# Patient Record
Sex: Female | Born: 1950 | Race: Black or African American | Hispanic: No | Marital: Married | Smoking: Never smoker
Health system: Southern US, Community
[De-identification: ages and names within clinical notes are randomized; demographics above are authoritative.]

## PROBLEM LIST (undated history)

## (undated) DIAGNOSIS — M549 Dorsalgia, unspecified: Secondary | ICD-10-CM

## (undated) DIAGNOSIS — D3610 Benign neoplasm of peripheral nerves and autonomic nervous system, unspecified: Secondary | ICD-10-CM

## (undated) DIAGNOSIS — R51 Headache: Secondary | ICD-10-CM

## (undated) DIAGNOSIS — Z6830 Body mass index (BMI) 30.0-30.9, adult: Secondary | ICD-10-CM

## (undated) DIAGNOSIS — D1722 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: Secondary | ICD-10-CM

## (undated) DIAGNOSIS — F410 Panic disorder [episodic paroxysmal anxiety] without agoraphobia: Secondary | ICD-10-CM

## (undated) DIAGNOSIS — K259 Gastric ulcer, unspecified as acute or chronic, without hemorrhage or perforation: Secondary | ICD-10-CM

## (undated) DIAGNOSIS — R519 Headache, unspecified: Secondary | ICD-10-CM

## (undated) DIAGNOSIS — K449 Diaphragmatic hernia without obstruction or gangrene: Secondary | ICD-10-CM

## (undated) DIAGNOSIS — R5383 Other fatigue: Secondary | ICD-10-CM

## (undated) DIAGNOSIS — J349 Unspecified disorder of nose and nasal sinuses: Secondary | ICD-10-CM

## (undated) DIAGNOSIS — B2790 Infectious mononucleosis, unspecified without complication: Secondary | ICD-10-CM

## (undated) DIAGNOSIS — E039 Hypothyroidism, unspecified: Secondary | ICD-10-CM

## (undated) DIAGNOSIS — R140 Abdominal distension (gaseous): Secondary | ICD-10-CM

## (undated) DIAGNOSIS — I499 Cardiac arrhythmia, unspecified: Secondary | ICD-10-CM

## (undated) DIAGNOSIS — R109 Unspecified abdominal pain: Secondary | ICD-10-CM

## (undated) DIAGNOSIS — M810 Age-related osteoporosis without current pathological fracture: Secondary | ICD-10-CM

## (undated) DIAGNOSIS — M25511 Pain in right shoulder: Secondary | ICD-10-CM

## (undated) DIAGNOSIS — K219 Gastro-esophageal reflux disease without esophagitis: Secondary | ICD-10-CM

## (undated) DIAGNOSIS — I119 Hypertensive heart disease without heart failure: Secondary | ICD-10-CM

## (undated) DIAGNOSIS — R0602 Shortness of breath: Secondary | ICD-10-CM

## (undated) DIAGNOSIS — M797 Fibromyalgia: Secondary | ICD-10-CM

## (undated) DIAGNOSIS — K589 Irritable bowel syndrome without diarrhea: Secondary | ICD-10-CM

## (undated) DIAGNOSIS — E559 Vitamin D deficiency, unspecified: Secondary | ICD-10-CM

## (undated) DIAGNOSIS — R600 Localized edema: Secondary | ICD-10-CM

## (undated) DIAGNOSIS — H9209 Otalgia, unspecified ear: Secondary | ICD-10-CM

## (undated) DIAGNOSIS — G43909 Migraine, unspecified, not intractable, without status migrainosus: Secondary | ICD-10-CM

## (undated) DIAGNOSIS — C642 Malignant neoplasm of left kidney, except renal pelvis: Secondary | ICD-10-CM

## (undated) DIAGNOSIS — F32A Depression, unspecified: Secondary | ICD-10-CM

## (undated) DIAGNOSIS — M419 Scoliosis, unspecified: Secondary | ICD-10-CM

## (undated) DIAGNOSIS — D649 Anemia, unspecified: Secondary | ICD-10-CM

## (undated) DIAGNOSIS — F419 Anxiety disorder, unspecified: Secondary | ICD-10-CM

## (undated) DIAGNOSIS — M542 Cervicalgia: Secondary | ICD-10-CM

## (undated) DIAGNOSIS — I10 Essential (primary) hypertension: Secondary | ICD-10-CM

## (undated) DIAGNOSIS — J309 Allergic rhinitis, unspecified: Secondary | ICD-10-CM

## (undated) DIAGNOSIS — Z91018 Allergy to other foods: Secondary | ICD-10-CM

## (undated) DIAGNOSIS — R079 Chest pain, unspecified: Secondary | ICD-10-CM

## (undated) DIAGNOSIS — B259 Cytomegaloviral disease, unspecified: Secondary | ICD-10-CM

## (undated) DIAGNOSIS — R1030 Lower abdominal pain, unspecified: Secondary | ICD-10-CM

## (undated) DIAGNOSIS — E785 Hyperlipidemia, unspecified: Secondary | ICD-10-CM

## (undated) DIAGNOSIS — G4730 Sleep apnea, unspecified: Secondary | ICD-10-CM

## (undated) DIAGNOSIS — R5381 Other malaise: Secondary | ICD-10-CM

## (undated) DIAGNOSIS — N898 Other specified noninflammatory disorders of vagina: Secondary | ICD-10-CM

## (undated) DIAGNOSIS — F515 Nightmare disorder: Secondary | ICD-10-CM

## (undated) DIAGNOSIS — R002 Palpitations: Secondary | ICD-10-CM

## (undated) DIAGNOSIS — F514 Sleep terrors [night terrors]: Secondary | ICD-10-CM

## (undated) DIAGNOSIS — M25512 Pain in left shoulder: Secondary | ICD-10-CM

## (undated) DIAGNOSIS — E349 Endocrine disorder, unspecified: Secondary | ICD-10-CM

## (undated) DIAGNOSIS — R112 Nausea with vomiting, unspecified: Secondary | ICD-10-CM

## (undated) DIAGNOSIS — K5900 Constipation, unspecified: Secondary | ICD-10-CM

## (undated) DIAGNOSIS — G4700 Insomnia, unspecified: Secondary | ICD-10-CM

## (undated) DIAGNOSIS — R197 Diarrhea, unspecified: Secondary | ICD-10-CM

## (undated) DIAGNOSIS — M2550 Pain in unspecified joint: Secondary | ICD-10-CM

## (undated) DIAGNOSIS — J189 Pneumonia, unspecified organism: Secondary | ICD-10-CM

## (undated) HISTORY — DX: Other malaise: R53.83

## (undated) HISTORY — DX: Malignant neoplasm of left kidney, except renal pelvis: C64.2

## (undated) HISTORY — DX: Localized edema: R60.0

## (undated) HISTORY — DX: Shortness of breath: R06.02

## (undated) HISTORY — DX: Nausea with vomiting, unspecified: R11.2

## (undated) HISTORY — DX: Vitamin D deficiency, unspecified: E55.9

## (undated) HISTORY — DX: Body mass index (BMI) 30.0-30.9, adult: Z68.30

## (undated) HISTORY — DX: 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: D17.22

## (undated) HISTORY — DX: Other specified noninflammatory disorders of vagina: N89.8

## (undated) HISTORY — DX: Other malaise: R53.81

## (undated) HISTORY — DX: Depression, unspecified: F32.A

## (undated) HISTORY — DX: Otalgia, unspecified ear: H92.09

## (undated) HISTORY — DX: Sleep terrors (night terrors): F51.4

## (undated) HISTORY — PX: ANKLE SURGERY: SHX546

## (undated) HISTORY — DX: Chest pain, unspecified: R07.9

## (undated) HISTORY — DX: Diaphragmatic hernia without obstruction or gangrene: K44.9

## (undated) HISTORY — DX: Scoliosis, unspecified: M41.9

## (undated) HISTORY — DX: Allergic rhinitis, unspecified: J30.9

## (undated) HISTORY — DX: Cytomegaloviral disease, unspecified: B25.9

## (undated) HISTORY — DX: Hypothyroidism, unspecified: E03.9

## (undated) HISTORY — DX: Gastric ulcer, unspecified as acute or chronic, without hemorrhage or perforation: K25.9

## (undated) HISTORY — DX: Anemia, unspecified: D64.9

## (undated) HISTORY — DX: Pain in left shoulder: M25.512

## (undated) HISTORY — DX: Palpitations: R00.2

## (undated) HISTORY — DX: Hyperlipidemia, unspecified: E78.5

## (undated) HISTORY — DX: Pain in unspecified joint: M25.50

## (undated) HISTORY — PX: UPPER GASTROINTESTINAL ENDOSCOPY: SHX188

## (undated) HISTORY — DX: Constipation, unspecified: K59.00

## (undated) HISTORY — DX: Abdominal distension (gaseous): R14.0

## (undated) HISTORY — DX: Sleep apnea, unspecified: G47.30

## (undated) HISTORY — DX: Benign neoplasm of peripheral nerves and autonomic nervous system, unspecified: D36.10

## (undated) HISTORY — DX: Migraine, unspecified, not intractable, without status migrainosus: G43.909

## (undated) HISTORY — DX: Cervicalgia: M54.2

## (undated) HISTORY — PX: OTHER SURGICAL HISTORY: SHX169

## (undated) HISTORY — DX: Nightmare disorder: F51.5

## (undated) HISTORY — DX: Lower abdominal pain, unspecified: R10.30

## (undated) HISTORY — PX: LEFT HEART CATH AND CORONARY ANGIOGRAPHY: CATH118249

## (undated) HISTORY — DX: Allergy to other foods: Z91.018

## (undated) HISTORY — DX: Unspecified abdominal pain: R10.9

## (undated) HISTORY — DX: Diarrhea, unspecified: R19.7

## (undated) HISTORY — DX: Gastro-esophageal reflux disease without esophagitis: K21.9

## (undated) HISTORY — PX: ABDOMINAL HYSTERECTOMY: SHX81

## (undated) HISTORY — DX: Pain in right shoulder: M25.511

## (undated) HISTORY — DX: Infectious mononucleosis, unspecified without complication: B27.90

## (undated) HISTORY — DX: Endocrine disorder, unspecified: E34.9

## (undated) HISTORY — PX: DILATION AND CURETTAGE OF UTERUS: SHX78

## (undated) HISTORY — DX: Hypertensive heart disease without heart failure: I11.9

## (undated) HISTORY — DX: Dorsalgia, unspecified: M54.9

## (undated) HISTORY — DX: Panic disorder (episodic paroxysmal anxiety): F41.0

## (undated) HISTORY — DX: Unspecified disorder of nose and nasal sinuses: J34.9

## (undated) HISTORY — DX: Age-related osteoporosis without current pathological fracture: M81.0

## (undated) HISTORY — DX: Anxiety disorder, unspecified: F41.9

## (undated) HISTORY — DX: Irritable bowel syndrome, unspecified: K58.9

## (undated) HISTORY — DX: Fibromyalgia: M79.7

## (undated) HISTORY — PX: TUBAL LIGATION: SHX77

## (undated) HISTORY — DX: Insomnia, unspecified: G47.00

---

## 2013-07-05 DIAGNOSIS — B001 Herpesviral vesicular dermatitis: Secondary | ICD-10-CM | POA: Insufficient documentation

## 2014-03-09 DIAGNOSIS — F5104 Psychophysiologic insomnia: Secondary | ICD-10-CM | POA: Insufficient documentation

## 2014-03-15 DIAGNOSIS — F411 Generalized anxiety disorder: Secondary | ICD-10-CM | POA: Insufficient documentation

## 2014-03-16 DIAGNOSIS — I447 Left bundle-branch block, unspecified: Secondary | ICD-10-CM | POA: Insufficient documentation

## 2015-04-12 DIAGNOSIS — F329 Major depressive disorder, single episode, unspecified: Secondary | ICD-10-CM | POA: Insufficient documentation

## 2015-07-10 DIAGNOSIS — H6120 Impacted cerumen, unspecified ear: Secondary | ICD-10-CM | POA: Insufficient documentation

## 2015-07-10 DIAGNOSIS — H903 Sensorineural hearing loss, bilateral: Secondary | ICD-10-CM | POA: Insufficient documentation

## 2016-02-28 DIAGNOSIS — S93491A Sprain of other ligament of right ankle, initial encounter: Secondary | ICD-10-CM | POA: Insufficient documentation

## 2016-07-29 DIAGNOSIS — M810 Age-related osteoporosis without current pathological fracture: Secondary | ICD-10-CM | POA: Insufficient documentation

## 2017-03-03 DIAGNOSIS — I10 Essential (primary) hypertension: Secondary | ICD-10-CM | POA: Diagnosis not present

## 2017-03-03 DIAGNOSIS — R69 Illness, unspecified: Secondary | ICD-10-CM | POA: Diagnosis not present

## 2017-03-04 DIAGNOSIS — R200 Anesthesia of skin: Secondary | ICD-10-CM | POA: Diagnosis not present

## 2017-03-04 DIAGNOSIS — R51 Headache: Secondary | ICD-10-CM | POA: Diagnosis not present

## 2017-03-04 DIAGNOSIS — G43909 Migraine, unspecified, not intractable, without status migrainosus: Secondary | ICD-10-CM | POA: Diagnosis not present

## 2017-03-04 DIAGNOSIS — R079 Chest pain, unspecified: Secondary | ICD-10-CM | POA: Diagnosis not present

## 2017-03-10 DIAGNOSIS — M25511 Pain in right shoulder: Secondary | ICD-10-CM | POA: Diagnosis not present

## 2017-04-13 DIAGNOSIS — K5909 Other constipation: Secondary | ICD-10-CM | POA: Diagnosis not present

## 2017-04-15 DIAGNOSIS — M545 Low back pain: Secondary | ICD-10-CM | POA: Diagnosis not present

## 2017-04-20 DIAGNOSIS — M545 Low back pain: Secondary | ICD-10-CM | POA: Diagnosis not present

## 2017-04-29 DIAGNOSIS — K5904 Chronic idiopathic constipation: Secondary | ICD-10-CM | POA: Diagnosis not present

## 2017-04-29 DIAGNOSIS — M545 Low back pain: Secondary | ICD-10-CM | POA: Diagnosis not present

## 2017-04-30 DIAGNOSIS — R109 Unspecified abdominal pain: Secondary | ICD-10-CM | POA: Diagnosis not present

## 2017-04-30 DIAGNOSIS — R1084 Generalized abdominal pain: Secondary | ICD-10-CM | POA: Diagnosis not present

## 2017-05-04 DIAGNOSIS — D49512 Neoplasm of unspecified behavior of left kidney: Secondary | ICD-10-CM | POA: Diagnosis not present

## 2017-05-05 ENCOUNTER — Other Ambulatory Visit: Payer: Self-pay | Admitting: Urology

## 2017-05-05 DIAGNOSIS — R1900 Intra-abdominal and pelvic swelling, mass and lump, unspecified site: Secondary | ICD-10-CM

## 2017-05-10 ENCOUNTER — Other Ambulatory Visit: Payer: Self-pay | Admitting: Radiology

## 2017-05-10 ENCOUNTER — Other Ambulatory Visit: Payer: Self-pay | Admitting: Student

## 2017-05-11 ENCOUNTER — Encounter (HOSPITAL_COMMUNITY): Payer: Self-pay | Admitting: *Deleted

## 2017-05-11 ENCOUNTER — Ambulatory Visit (HOSPITAL_COMMUNITY)
Admission: RE | Admit: 2017-05-11 | Discharge: 2017-05-11 | Disposition: A | Discharge status: Home / Self Care | Payer: Medicare HMO | Source: Ambulatory Visit | Attending: Urology | Admitting: Urology

## 2017-05-11 DIAGNOSIS — N2889 Other specified disorders of kidney and ureter: Secondary | ICD-10-CM | POA: Insufficient documentation

## 2017-05-11 DIAGNOSIS — Z880 Allergy status to penicillin: Secondary | ICD-10-CM | POA: Insufficient documentation

## 2017-05-11 DIAGNOSIS — N289 Disorder of kidney and ureter, unspecified: Secondary | ICD-10-CM | POA: Diagnosis not present

## 2017-05-11 DIAGNOSIS — Z882 Allergy status to sulfonamides status: Secondary | ICD-10-CM | POA: Diagnosis not present

## 2017-05-11 DIAGNOSIS — Z79891 Long term (current) use of opiate analgesic: Secondary | ICD-10-CM | POA: Diagnosis not present

## 2017-05-11 DIAGNOSIS — Z79899 Other long term (current) drug therapy: Secondary | ICD-10-CM | POA: Insufficient documentation

## 2017-05-11 DIAGNOSIS — R1909 Other intra-abdominal and pelvic swelling, mass and lump: Secondary | ICD-10-CM | POA: Diagnosis not present

## 2017-05-11 DIAGNOSIS — M549 Dorsalgia, unspecified: Secondary | ICD-10-CM | POA: Insufficient documentation

## 2017-05-11 DIAGNOSIS — R1900 Intra-abdominal and pelvic swelling, mass and lump, unspecified site: Secondary | ICD-10-CM | POA: Insufficient documentation

## 2017-05-11 LAB — CBC
HEMATOCRIT: 44.2 % (ref 36.0–46.0)
Hemoglobin: 14.8 g/dL (ref 12.0–15.0)
MCH: 30.1 pg (ref 26.0–34.0)
MCHC: 33.5 g/dL (ref 30.0–36.0)
MCV: 89.8 fL (ref 78.0–100.0)
Platelets: 236 10*3/uL (ref 150–400)
RBC: 4.92 MIL/uL (ref 3.87–5.11)
RDW: 14.3 % (ref 11.5–15.5)
WBC: 4.8 10*3/uL (ref 4.0–10.5)

## 2017-05-11 LAB — PROTIME-INR
INR: 1.02
Prothrombin Time: 13.3 seconds (ref 11.4–15.2)

## 2017-05-11 LAB — APTT: APTT: 29 s (ref 24–36)

## 2017-05-11 MED ORDER — MIDAZOLAM HCL 2 MG/2ML IJ SOLN
INTRAMUSCULAR | Status: AC
  Filled 2017-05-11: qty 4

## 2017-05-11 MED ORDER — MIDAZOLAM HCL 2 MG/2ML IJ SOLN
INTRAMUSCULAR | Status: DC | PRN
  Administered 2017-05-11 (×3): 1 mg via INTRAVENOUS

## 2017-05-11 MED ORDER — HYDROCODONE-ACETAMINOPHEN 5-325 MG PO TABS: 1.0000 | ORAL_TABLET | ORAL | Status: DC | PRN

## 2017-05-11 MED ORDER — FENTANYL CITRATE (PF) 100 MCG/2ML IJ SOLN
INTRAMUSCULAR | Status: AC
  Filled 2017-05-11: qty 4

## 2017-05-11 MED ORDER — SODIUM CHLORIDE 0.9 % IV SOLN
INTRAVENOUS | Status: DC | PRN
  Administered 2017-05-11: 10 mL/h via INTRAVENOUS

## 2017-05-11 MED ORDER — FENTANYL CITRATE (PF) 100 MCG/2ML IJ SOLN
INTRAMUSCULAR | Status: DC | PRN
  Administered 2017-05-11 (×3): 50 ug via INTRAVENOUS

## 2017-05-11 MED ORDER — LIDOCAINE HCL 1 % IJ SOLN
INTRAMUSCULAR | Status: AC
  Filled 2017-05-11: qty 20

## 2017-05-11 MED ORDER — SODIUM CHLORIDE 0.9 % IV SOLN: INTRAVENOUS | Status: DC

## 2017-05-11 NOTE — Sedation Documentation (Signed)
Patient is resting comfortably. 

## 2017-05-11 NOTE — Discharge Instructions (Signed)
Needle Biopsy, Care After °These instructions give you information about caring for yourself after your procedure. Your doctor may also give you more specific instructions. Call your doctor if you have any problems or questions after your procedure. °Follow these instructions at home: °· Rest as told by your doctor. °· Take medicines only as told by your doctor. °· There are many different ways to close and cover the biopsy site, including stitches (sutures), skin glue, and adhesive strips. Follow instructions from your doctor about: °? How to take care of your biopsy site. °? When and how you should change your bandage (dressing). °? When you should remove your dressing. °? Removing whatever was used to close your biopsy site. °· Check your biopsy site every day for signs of infection. Watch for: °? Redness, swelling, or pain. °? Fluid, blood, or pus. °Contact a doctor if: °· You have a fever. °· You have redness, swelling, or pain at the biopsy site, and it lasts longer than a few days. °· You have fluid, blood, or pus coming from the biopsy site. °· You feel sick to your stomach (nauseous). °· You throw up (vomit). °Get help right away if: °· You are short of breath. °· You have trouble breathing. °· Your chest hurts. °· You feel dizzy or you pass out (faint). °· You have bleeding that does not stop with pressure or a bandage. °· You cough up blood. °· Your belly (abdomen) hurts. °This information is not intended to replace advice given to you by your health care provider. Make sure you discuss any questions you have with your health care provider. °Document Released: 05/28/2008 Document Revised: 11/21/2015 Document Reviewed: 06/11/2014 °Elsevier Interactive Patient Education © 2018 [employer] ° °

## 2017-05-11 NOTE — Procedures (Signed)
CT guided core biopsies of right retroperitoneal lesion.  7 cores obtained.  1 specimen placed in saline.  Minimal blood loss and no immediate complication.

## 2017-05-11 NOTE — H&P (Signed)
Chief Complaint: Patient was seen in consultation today for right retroperitoneal mass biopsy at the request of Herrick,Benjamin W  Referring Physician(s): Herrick,Benjamin W  Supervising Physician: Markus Daft  Patient Status: [facility] - Out-pt  History of Present Illness: Maria Middleton is a 66 y.o. 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 No pertinent past medical history.  History reviewed. No pertinent surgical history.  Allergies: Penicillins and Sulfa antibiotics  Medications: Prior to Admission medications   Medication Sig Start Date End Date Taking? Authorizing Provider  Ascorbic Acid (VITAMIN C PO) Take 1,500 mg daily as needed by mouth (stress).   Yes [provider]  Garlic 299 MG CAPS Take 1 capsule daily by mouth.   Yes [provider]  LORazepam (ATIVAN) 0.5 MG tablet Take 0.5 mg at bedtime as needed by mouth for sleep.   Yes [provider]  OVER THE COUNTER MEDICATION Take 1 drop as needed by mouth (Anxiety). Flower Herbal Essence - Bleeding Heart or Chamomile   Yes [provider]  PAU D ARCO PO Take 3 capsules at bedtime by mouth. 540 mg per cap   Yes [provider]  traMADol (ULTRAM) 50 MG tablet Take 50 mg at bedtime as needed by mouth for moderate pain.   Yes [provider]  valsartan-hydrochlorothiazide (DIOVAN-HCT) 160-12.5 MG tablet Take 1 tablet daily by mouth.   Yes [provider]  Vitamin D, Ergocalciferol, (DRISDOL) 50000 units CAPS capsule Take 50,000 Units every 7  (seven) days by mouth. Sundays   Yes [provider]  Licorice, Glycyrrhiza glabra, (LICORICE ROOT PO) Take 1 capsule daily as needed by mouth (acid reflux).    [provider]     History reviewed. No pertinent family history.  Social History   Socioeconomic History  . Marital status: Single    Spouse name: None  . Number of children: None  . Years of education: None  . Highest education level: None  Social Needs  . Financial resource strain: None  . Food insecurity - worry: None  . Food insecurity - inability: None  . Transportation needs - medical: None  . Transportation needs - non-medical: None  Occupational History  . None  Tobacco Use  . Smoking status: None  Substance and Sexual Activity  . Alcohol use: None  . Drug use: None  . Sexual activity: None  Other Topics Concern  . None  Social History Narrative  . None    Review of Systems: A 12 point ROS discussed and pertinent positives are indicated in the HPI above.  All other systems are negative.  Review of Systems  Constitutional: Negative for activity change, fatigue and fever.  Respiratory: Negative for shortness of breath.   Cardiovascular: Negative for chest pain.  Gastrointestinal: Negative for abdominal pain.  Genitourinary: Positive for flank pain.  Musculoskeletal: Positive for back pain.  Psychiatric/Behavioral: Negative for behavioral problems and confusion.    Vital Signs: BP (!) 146/96 (BP Location: Right Arm)   Pulse 90   Temp (!) 97.4 F (36.3 C) (Oral)   Resp  18   Ht 5\' 3"  (1.6 m)   Wt 165 lb (74.8 kg)   SpO2 100%   BMI 29.23 kg/m   Physical Exam  Constitutional: She is oriented to person, place, and time.  Cardiovascular: Normal rate, regular rhythm and normal heart sounds.  Pulmonary/Chest: Effort normal.  Abdominal: Soft. Bowel sounds are normal.  Musculoskeletal: Normal range of motion.  Neurological: She is alert and oriented to person, place, and time.    Skin: Skin is warm and dry.  Psychiatric: She has a normal mood and affect. Her behavior is normal. Judgment and thought content normal.  Nursing note and vitals reviewed.   Imaging: No results found.  Labs:  CBC: Recent Labs    05/11/17 0935  WBC 4.8  HGB 14.8  HCT 44.2  PLT 236    COAGS: Recent Labs    05/11/17 0935  INR 1.02  APTT 29    BMP: No results for input(s): NA, K, CL, CO2, GLUCOSE, BUN, CALCIUM, CREATININE, GFRNONAA, GFRAA in the last 8760 hours.  Invalid input(s): CMP  LIVER FUNCTION TESTS: No results for input(s): BILITOT, AST, ALT, ALKPHOS, PROT, ALBUMIN in the last 8760 hours.  TUMOR MARKERS: No results for input(s): AFPTM, CEA, CA199, CHROMGRNA in the last 8760 hours.  Assessment and Plan:  Left renal mass Right retroperitoneal mass Scheduled for R RP mass biopsy today (plan for renal mass to be determined)  Risks and benefits discussed with the patient including, but not limited to bleeding, infection, damage to adjacent structures or low yield requiring additional tests. All of the patient's questions were answered, patient is agreeable to proceed. Consent signed and in chart.  Thank you for this interesting consult.  I greatly enjoyed meeting [name] and look forward to participating in their care.  A copy of this report was sent to the requesting provider on this date.  Electronically Signed: Lavonia Drafts, PA-C 05/11/2017, 10:46 AM   I spent a total of  30 Minutes   in face to face in clinical consultation, greater than 50% of which was counseling/coordinating care for right RP mass biopsy

## 2017-06-10 DIAGNOSIS — R1900 Intra-abdominal and pelvic swelling, mass and lump, unspecified site: Secondary | ICD-10-CM | POA: Diagnosis not present

## 2017-06-17 DIAGNOSIS — R1900 Intra-abdominal and pelvic swelling, mass and lump, unspecified site: Secondary | ICD-10-CM | POA: Diagnosis not present

## 2017-06-17 DIAGNOSIS — N289 Disorder of kidney and ureter, unspecified: Secondary | ICD-10-CM | POA: Diagnosis not present

## 2017-06-17 DIAGNOSIS — M47816 Spondylosis without myelopathy or radiculopathy, lumbar region: Secondary | ICD-10-CM | POA: Diagnosis not present

## 2017-07-06 DIAGNOSIS — D49512 Neoplasm of unspecified behavior of left kidney: Secondary | ICD-10-CM | POA: Diagnosis not present

## 2017-07-08 ENCOUNTER — Other Ambulatory Visit: Payer: Self-pay | Admitting: Urology

## 2017-07-09 ENCOUNTER — Other Ambulatory Visit: Payer: Self-pay | Admitting: Urology

## 2017-08-11 NOTE — Patient Instructions (Addendum)
Maria Middleton  08/11/2017   Your procedure is scheduled on: Wednesday 08/25/2017  Report to [facility] Main  Entrance  Follow signs to Short Stay on first floor at 530 AM   Call this number if you have problems the morning of surgery [phone]               Follow bowel prep instructions from Dr. Carlton Adam office the day before surgery along with a clear liquid diet up until midnight!                 CLEAR LIQUID DIET   Foods Allowed                                                                     Foods Excluded  Coffee and tea, regular and decaf                             liquids that you cannot  Plain Jell-O in any flavor                                             see through such as: Fruit ices (not with fruit pulp)                                     milk, soups, orange juice  Iced Popsicles                                    All solid food Carbonated beverages, regular and diet                                    Cranberry, grape and apple juices Sports drinks like Gatorade Lightly seasoned clear broth or consume(fat free) Sugar, honey syrup  Sample Menu Breakfast                                Lunch                                     Supper Cranberry juice                    Beef broth                            Chicken broth Jell-O                                     Grape juice  Apple juice Coffee or tea                        Jell-O                                      Popsicle                                                Coffee or tea                        Coffee or tea  _____________________________________________________________________      Remember: Do not eat food or drink liquids :After Midnight.     Take these medicines the morning of surgery with A SIP OF WATER: none                                You may not have any metal on your body including hair pins and              piercings   Do not wear jewelry, make-up, lotions, powders or perfumes, deodorant             Do not wear nail polish.  Do not shave  48 hours prior to surgery.              Men may shave face and neck.   Do not bring valuables to the hospital. [place].  Contacts, dentures or bridgework may not be worn into surgery.  Leave suitcase in the car. After surgery it may be brought to your room.                 Please read over the following fact sheets you were given: _____________________________________________________________________             [facility] - Preparing for Surgery Before surgery, you can play an important role.  Because skin is not sterile, your skin needs to be as free of germs as possible.  You can reduce the number of germs on your skin by washing with CHG (chlorahexidine gluconate) soap before surgery.  CHG is an antiseptic cleaner which kills germs and bonds with the skin to continue killing germs even after washing. Please DO NOT use if you have an allergy to CHG or antibacterial soaps.  If your skin becomes reddened/irritated stop using the CHG and inform your nurse when you arrive at Short Stay. Do not shave (including legs and underarms) for at least 48 hours prior to the first CHG shower.  You may shave your face/neck. Please follow these instructions carefully:  1.  Shower with CHG Soap the night before surgery and the  morning of Surgery.  2.  If you choose to wash your hair, wash your hair first as usual with your  normal  shampoo.  3.  After you shampoo, rinse your hair and body thoroughly to remove the  shampoo.  4.  Use CHG as you would any other liquid soap.  You can apply chg directly  to the skin and wash                       Gently with a scrungie or clean washcloth.  5.  Apply the CHG Soap to your body ONLY FROM THE NECK DOWN.   Do not use on face/ open                           Wound or  open sores. Avoid contact with eyes, ears mouth and genitals (private parts).                       Wash face,  Genitals (private parts) with your normal soap.             6.  Wash thoroughly, paying special attention to the area where your surgery  will be performed.  7.  Thoroughly rinse your body with warm water from the neck down.  8.  DO NOT shower/wash with your normal soap after using and rinsing off  the CHG Soap.                9.  Pat yourself dry with a clean towel.            10.  Wear clean pajamas.            11.  Place clean sheets on your bed the night of your first shower and do not  sleep with pets. Day of Surgery : Do not apply any lotions/deodorants the morning of surgery.  Please wear clean clothes to the hospital/surgery center.  FAILURE TO FOLLOW THESE INSTRUCTIONS MAY RESULT IN THE CANCELLATION OF YOUR SURGERY PATIENT SIGNATURE_________________________________  NURSE SIGNATURE__________________________________  ________________________________________________________________________  WHAT IS A BLOOD TRANSFUSION? Blood Transfusion Information  A transfusion is the replacement of blood or some of its parts. Blood is made up of multiple cells which provide different functions.  Red blood cells carry oxygen and are used for blood loss replacement.  White blood cells fight against infection.  Platelets control bleeding.  Plasma helps clot blood.  Other blood products are available for specialized needs, such as hemophilia or other clotting disorders. BEFORE THE TRANSFUSION  Who gives blood for transfusions?   Healthy volunteers who are fully evaluated to make sure their blood is safe. This is blood bank blood. Transfusion therapy is the safest it has ever been in the practice of medicine. Before blood is taken from a donor, a complete history is taken to make sure that person has no history of diseases nor engages in risky social behavior (examples are  intravenous drug use or sexual activity with multiple partners). The donor's travel history is screened to minimize risk of transmitting infections, such as malaria. The donated blood is tested for signs of infectious diseases, such as HIV and hepatitis. The blood is then tested to be sure it is compatible with you in order to minimize the chance of a transfusion reaction. If you or a relative donates blood, this is often done in anticipation of surgery and is not appropriate for emergency situations. It takes many days to process the donated blood. RISKS AND COMPLICATIONS Although transfusion therapy is very safe and saves many lives, the main dangers of transfusion include:   Getting an infectious disease.  Developing a transfusion reaction.  This is an allergic reaction to something in the blood you were given. Every precaution is taken to prevent this. The decision to have a blood transfusion has been considered carefully by your caregiver before blood is given. Blood is not given unless the benefits outweigh the risks. AFTER THE TRANSFUSION  Right after receiving a blood transfusion, you will usually feel much better and more energetic. This is especially true if your red blood cells have gotten low (anemic). The transfusion raises the level of the red blood cells which carry oxygen, and this usually causes an energy increase.  The nurse administering the transfusion will monitor you carefully for complications. HOME CARE INSTRUCTIONS  No special instructions are needed after a transfusion. You may find your energy is better. Speak with your caregiver about any limitations on activity for underlying diseases you may have. SEEK MEDICAL CARE IF:   Your condition is not improving after your transfusion.  You develop redness or irritation at the intravenous (IV) site. SEEK IMMEDIATE MEDICAL CARE IF:  Any of the following symptoms occur over the next 12 hours:  Shaking chills.  You have a  temperature by mouth above 102 F (38.9 C), not controlled by medicine.  Chest, back, or muscle pain.  People around you feel you are not acting correctly or are confused.  Shortness of breath or difficulty breathing.  Dizziness and fainting.  You get a rash or develop hives.  You have a decrease in urine output.  Your urine turns a dark color or changes to pink, red, or brown. Any of the following symptoms occur over the next 10 days:  You have a temperature by mouth above 102 F (38.9 C), not controlled by medicine.  Shortness of breath.  Weakness after normal activity.  The white part of the eye turns yellow (jaundice).  You have a decrease in the amount of urine or are urinating less often.  Your urine turns a dark color or changes to pink, red, or brown. Document Released: 06/12/2000 Document Revised: 09/07/2011 Document Reviewed: 01/30/2008 [facility] Patient Information 2014 [place], [place].  _______________________________________________________________________

## 2017-08-16 ENCOUNTER — Encounter (HOSPITAL_COMMUNITY)
Admission: RE | Admit: 2017-08-16 | Discharge: 2017-08-16 | Disposition: A | Discharge status: Home / Self Care | Payer: Medicare Other | Source: Ambulatory Visit | Attending: Urology | Admitting: Urology

## 2017-08-16 ENCOUNTER — Encounter (HOSPITAL_COMMUNITY): Payer: Self-pay | Admitting: *Deleted

## 2017-08-16 ENCOUNTER — Other Ambulatory Visit: Payer: Self-pay

## 2017-08-16 DIAGNOSIS — Z01812 Encounter for preprocedural laboratory examination: Secondary | ICD-10-CM | POA: Insufficient documentation

## 2017-08-16 DIAGNOSIS — Z01810 Encounter for preprocedural cardiovascular examination: Secondary | ICD-10-CM | POA: Insufficient documentation

## 2017-08-16 DIAGNOSIS — N289 Disorder of kidney and ureter, unspecified: Secondary | ICD-10-CM | POA: Insufficient documentation

## 2017-08-16 DIAGNOSIS — I447 Left bundle-branch block, unspecified: Secondary | ICD-10-CM | POA: Diagnosis not present

## 2017-08-16 DIAGNOSIS — I10 Essential (primary) hypertension: Secondary | ICD-10-CM | POA: Insufficient documentation

## 2017-08-16 HISTORY — DX: Cardiac arrhythmia, unspecified: I49.9

## 2017-08-16 HISTORY — DX: Headache, unspecified: R51.9

## 2017-08-16 HISTORY — DX: Essential (primary) hypertension: I10

## 2017-08-16 HISTORY — DX: Headache: R51

## 2017-08-16 HISTORY — DX: Pneumonia, unspecified organism: J18.9

## 2017-08-16 LAB — COMPREHENSIVE METABOLIC PANEL
ALK PHOS: 65 U/L (ref 38–126)
ALT: 11 U/L — AB (ref 14–54)
AST: 21 U/L (ref 15–41)
Albumin: 4.2 g/dL (ref 3.5–5.0)
Anion gap: 10 (ref 5–15)
BUN: 20 mg/dL (ref 6–20)
CALCIUM: 9.6 mg/dL (ref 8.9–10.3)
CO2: 26 mmol/L (ref 22–32)
Chloride: 103 mmol/L (ref 101–111)
Creatinine, Ser: 1.04 mg/dL — ABNORMAL HIGH (ref 0.44–1.00)
GFR, EST NON AFRICAN AMERICAN: 54 mL/min — AB (ref >60)
Glucose, Bld: 97 mg/dL (ref 65–99)
Potassium: 3.6 mmol/L (ref 3.5–5.1)
Sodium: 139 mmol/L (ref 135–145)
Total Bilirubin: 0.6 mg/dL (ref 0.3–1.2)
Total Protein: 7.2 g/dL (ref 6.5–8.1)

## 2017-08-16 LAB — CBC
HEMATOCRIT: 43 % (ref 36.0–46.0)
HEMOGLOBIN: 14.6 g/dL (ref 12.0–15.0)
MCH: 30.8 pg (ref 26.0–34.0)
MCHC: 34 g/dL (ref 30.0–36.0)
MCV: 90.7 fL (ref 78.0–100.0)
Platelets: 210 10*3/uL (ref 150–400)
RBC: 4.74 MIL/uL (ref 3.87–5.11)
RDW: 14.7 % (ref 11.5–15.5)
WBC: 5.1 10*3/uL (ref 4.0–10.5)

## 2017-08-16 LAB — ABO/RH: ABO/RH(D): O POS

## 2017-08-18 NOTE — Progress Notes (Signed)
Spoke to Dr. Tanna Furry face to face about patient's history of Left Bundle Block. He reviewed her Stress test from 01/08/2015 and Echo from 007/05/2015 from [facility] . Per Dr. Jillyn Hidden, patient is OK for surgery.

## 2017-08-24 NOTE — Anesthesia Preprocedure Evaluation (Addendum)
Anesthesia Evaluation  Patient identified by MRN, date of birth, ID band Patient awake    Reviewed: Allergy & Precautions, NPO status , Patient's Chart, lab work & pertinent test results  History of Anesthesia Complications Negative for: history of anesthetic complications  Airway Mallampati: II  TM Distance: >3 FB     Dental no notable dental hx. (+) Dental Advisory Given   Pulmonary neg pulmonary ROS,    Pulmonary exam normal        Cardiovascular hypertension, Pt. on medications negative cardio ROS Normal cardiovascular exam     Neuro/Psych  Headaches, negative psych ROS   GI/Hepatic negative GI ROS, Neg liver ROS,   Endo/Other  negative endocrine ROS  Renal/GU negative Renal ROS     Musculoskeletal negative musculoskeletal ROS (+)   Abdominal   Peds  Hematology negative hematology ROS (+)   Anesthesia Other Findings Day of surgery medications reviewed with the patient.  Reproductive/Obstetrics                            Anesthesia Physical Anesthesia Plan  ASA: III  Anesthesia Plan: General   Post-op Pain Management:    Induction: Intravenous  PONV Risk Score and Plan: 4 or greater and Ondansetron, Dexamethasone, Scopolamine patch - Pre-op and Diphenhydramine  Airway Management Planned: Oral ETT  Additional Equipment:   Intra-op Plan:   Post-operative Plan: Extubation in OR  Informed Consent: I have reviewed the patients History and Physical, chart, labs and discussed the procedure including the risks, benefits and alternatives for the proposed anesthesia with the patient or authorized representative who has indicated his/her understanding and acceptance.   Dental advisory given  Plan Discussed with: CRNA, Anesthesiologist and Surgeon  Anesthesia Plan Comments:        Anesthesia Quick Evaluation

## 2017-08-25 ENCOUNTER — Other Ambulatory Visit: Payer: Self-pay

## 2017-08-25 ENCOUNTER — Encounter (HOSPITAL_COMMUNITY)
Admission: RE | Disposition: A | Discharge status: Home / Self Care | Payer: Self-pay | Source: Ambulatory Visit | Attending: Urology

## 2017-08-25 ENCOUNTER — Observation Stay (HOSPITAL_COMMUNITY)
Admission: RE | Admit: 2017-08-25 | Discharge: 2017-08-27 | Disposition: A | Discharge status: Home / Self Care | Payer: Medicare Other | Source: Ambulatory Visit | Attending: Urology | Admitting: Urology

## 2017-08-25 ENCOUNTER — Inpatient Hospital Stay (HOSPITAL_COMMUNITY): Payer: Medicare Other | Admitting: Anesthesiology

## 2017-08-25 ENCOUNTER — Encounter (HOSPITAL_COMMUNITY): Payer: Self-pay

## 2017-08-25 DIAGNOSIS — Z880 Allergy status to penicillin: Secondary | ICD-10-CM | POA: Diagnosis not present

## 2017-08-25 DIAGNOSIS — I10 Essential (primary) hypertension: Secondary | ICD-10-CM | POA: Diagnosis not present

## 2017-08-25 DIAGNOSIS — Z79899 Other long term (current) drug therapy: Secondary | ICD-10-CM | POA: Diagnosis not present

## 2017-08-25 DIAGNOSIS — N2889 Other specified disorders of kidney and ureter: Secondary | ICD-10-CM

## 2017-08-25 DIAGNOSIS — F329 Major depressive disorder, single episode, unspecified: Secondary | ICD-10-CM | POA: Insufficient documentation

## 2017-08-25 DIAGNOSIS — R2681 Unsteadiness on feet: Secondary | ICD-10-CM | POA: Diagnosis not present

## 2017-08-25 DIAGNOSIS — Z882 Allergy status to sulfonamides status: Secondary | ICD-10-CM | POA: Insufficient documentation

## 2017-08-25 DIAGNOSIS — Z7982 Long term (current) use of aspirin: Secondary | ICD-10-CM | POA: Insufficient documentation

## 2017-08-25 DIAGNOSIS — C642 Malignant neoplasm of left kidney, except renal pelvis: Secondary | ICD-10-CM | POA: Diagnosis not present

## 2017-08-25 DIAGNOSIS — K219 Gastro-esophageal reflux disease without esophagitis: Secondary | ICD-10-CM | POA: Diagnosis not present

## 2017-08-25 DIAGNOSIS — F419 Anxiety disorder, unspecified: Secondary | ICD-10-CM | POA: Insufficient documentation

## 2017-08-25 DIAGNOSIS — D49512 Neoplasm of unspecified behavior of left kidney: Secondary | ICD-10-CM | POA: Diagnosis not present

## 2017-08-25 HISTORY — PX: ROBOTIC ASSITED PARTIAL NEPHRECTOMY: SHX6087

## 2017-08-25 HISTORY — DX: Other specified disorders of kidney and ureter: N28.89

## 2017-08-25 LAB — HEMOGLOBIN AND HEMATOCRIT, BLOOD
HEMATOCRIT: 35.7 % — AB (ref 36.0–46.0)
HEMOGLOBIN: 12.2 g/dL (ref 12.0–15.0)

## 2017-08-25 LAB — BASIC METABOLIC PANEL
ANION GAP: 11 (ref 5–15)
BUN: 12 mg/dL (ref 6–20)
CO2: 24 mmol/L (ref 22–32)
CREATININE: 1.12 mg/dL — AB (ref 0.44–1.00)
Calcium: 8.7 mg/dL — ABNORMAL LOW (ref 8.9–10.3)
Chloride: 103 mmol/L (ref 101–111)
GFR, EST AFRICAN AMERICAN: 57 mL/min — AB (ref >60)
GFR, EST NON AFRICAN AMERICAN: 49 mL/min — AB (ref >60)
GLUCOSE: 136 mg/dL — AB (ref 65–99)
Potassium: 3.3 mmol/L — ABNORMAL LOW (ref 3.5–5.1)
Sodium: 138 mmol/L (ref 135–145)

## 2017-08-25 LAB — TYPE AND SCREEN
ABO/RH(D): O POS
ANTIBODY SCREEN: NEGATIVE

## 2017-08-25 SURGERY — NEPHRECTOMY, PARTIAL, ROBOT-ASSISTED
Anesthesia: General | Laterality: Left

## 2017-08-25 MED ORDER — ALBUMIN HUMAN 5 % IV SOLN
INTRAVENOUS | Status: AC
  Filled 2017-08-25: qty 250

## 2017-08-25 MED ORDER — LACTATED RINGERS IR SOLN
Status: DC | PRN
  Administered 2017-08-25: 1000 mL

## 2017-08-25 MED ORDER — SUGAMMADEX SODIUM 200 MG/2ML IV SOLN
INTRAVENOUS | Status: DC | PRN
  Administered 2017-08-25: 150 mg via INTRAVENOUS

## 2017-08-25 MED ORDER — SODIUM CHLORIDE 0.45 % IV SOLN
INTRAVENOUS | Status: DC
  Administered 2017-08-25: 15:00:00 via INTRAVENOUS

## 2017-08-25 MED ORDER — FENTANYL CITRATE (PF) 100 MCG/2ML IJ SOLN
INTRAMUSCULAR | Status: AC
  Filled 2017-08-25: qty 2

## 2017-08-25 MED ORDER — ONDANSETRON HCL 4 MG/2ML IJ SOLN
INTRAMUSCULAR | Status: AC
  Filled 2017-08-25: qty 2

## 2017-08-25 MED ORDER — FENTANYL CITRATE (PF) 100 MCG/2ML IJ SOLN
25.0000 ug | INTRAMUSCULAR | Status: DC | PRN
  Administered 2017-08-25 (×4): 25 ug via INTRAVENOUS
  Administered 2017-08-25: 50 ug via INTRAVENOUS

## 2017-08-25 MED ORDER — METHOCARBAMOL 1000 MG/10ML IJ SOLN
1000.0000 mg | Freq: Once | INTRAVENOUS | Status: AC
  Administered 2017-08-25: 1000 mg via INTRAVENOUS
  Filled 2017-08-25: qty 10

## 2017-08-25 MED ORDER — EPHEDRINE SULFATE-NACL 50-0.9 MG/10ML-% IV SOSY
PREFILLED_SYRINGE | INTRAVENOUS | Status: DC | PRN
  Administered 2017-08-25: 10 mg via INTRAVENOUS

## 2017-08-25 MED ORDER — PHENYLEPHRINE 40 MCG/ML (10ML) SYRINGE FOR IV PUSH (FOR BLOOD PRESSURE SUPPORT)
PREFILLED_SYRINGE | INTRAVENOUS | Status: DC | PRN
  Administered 2017-08-25 (×2): 80 ug via INTRAVENOUS
  Administered 2017-08-25: 120 ug via INTRAVENOUS
  Administered 2017-08-25: 80 ug via INTRAVENOUS

## 2017-08-25 MED ORDER — SODIUM CHLORIDE 0.9 % IJ SOLN
INTRAMUSCULAR | Status: AC
  Filled 2017-08-25: qty 20

## 2017-08-25 MED ORDER — LACTATED RINGERS IV SOLN
INTRAVENOUS | Status: DC | PRN
  Administered 2017-08-25 (×2): via INTRAVENOUS

## 2017-08-25 MED ORDER — ACETAMINOPHEN 10 MG/ML IV SOLN
1000.0000 mg | Freq: Four times a day (QID) | INTRAVENOUS | Status: DC
  Administered 2017-08-25: 1000 mg via INTRAVENOUS

## 2017-08-25 MED ORDER — FENTANYL CITRATE (PF) 250 MCG/5ML IJ SOLN
INTRAMUSCULAR | Status: AC
  Filled 2017-08-25: qty 5

## 2017-08-25 MED ORDER — HYDROMORPHONE HCL 1 MG/ML IJ SOLN
INTRAMUSCULAR | Status: AC
  Filled 2017-08-25: qty 1

## 2017-08-25 MED ORDER — BUPIVACAINE LIPOSOME 1.3 % IJ SUSP
20.0000 mL | Freq: Once | INTRAMUSCULAR | Status: AC
  Administered 2017-08-25: 20 mL
  Filled 2017-08-25: qty 20

## 2017-08-25 MED ORDER — DIPHENHYDRAMINE HCL 50 MG/ML IJ SOLN
INTRAMUSCULAR | Status: DC | PRN
  Administered 2017-08-25: 12.5 mg via INTRAVENOUS

## 2017-08-25 MED ORDER — OXYCODONE HCL 5 MG PO TABS
5.0000 mg | ORAL_TABLET | ORAL | Status: DC | PRN
  Administered 2017-08-26 (×2): 5 mg via ORAL
  Filled 2017-08-25 (×2): qty 1

## 2017-08-25 MED ORDER — MIDAZOLAM HCL 5 MG/5ML IJ SOLN
INTRAMUSCULAR | Status: DC | PRN
  Administered 2017-08-25: 2 mg via INTRAVENOUS

## 2017-08-25 MED ORDER — FENTANYL CITRATE (PF) 100 MCG/2ML IJ SOLN
INTRAMUSCULAR | Status: DC | PRN
  Administered 2017-08-25 (×2): 50 ug via INTRAVENOUS
  Administered 2017-08-25: 100 ug via INTRAVENOUS
  Administered 2017-08-25: 50 ug via INTRAVENOUS
  Administered 2017-08-25: 100 ug via INTRAVENOUS
  Administered 2017-08-25: 50 ug via INTRAVENOUS
  Administered 2017-08-25: 100 ug via INTRAVENOUS

## 2017-08-25 MED ORDER — MIDAZOLAM HCL 2 MG/2ML IJ SOLN
INTRAMUSCULAR | Status: AC
  Filled 2017-08-25: qty 2

## 2017-08-25 MED ORDER — PROPOFOL 10 MG/ML IV BOLUS
INTRAVENOUS | Status: DC | PRN
  Administered 2017-08-25: 40 mg via INTRAVENOUS
  Administered 2017-08-25: 160 mg via INTRAVENOUS

## 2017-08-25 MED ORDER — ALBUMIN HUMAN 5 % IV SOLN
INTRAVENOUS | Status: DC | PRN
  Administered 2017-08-25 (×2): via INTRAVENOUS

## 2017-08-25 MED ORDER — BUPIVACAINE-EPINEPHRINE (PF) 0.5% -1:200000 IJ SOLN
INTRAMUSCULAR | Status: AC
  Filled 2017-08-25: qty 30

## 2017-08-25 MED ORDER — STERILE WATER FOR IRRIGATION IR SOLN
Status: DC | PRN
  Administered 2017-08-25: 1000 mL

## 2017-08-25 MED ORDER — SENNOSIDES-DOCUSATE SODIUM 8.6-50 MG PO TABS
2.0000 | ORAL_TABLET | Freq: Every day | ORAL | Status: DC
  Administered 2017-08-25 – 2017-08-26 (×2): 2 via ORAL
  Filled 2017-08-25 (×2): qty 2

## 2017-08-25 MED ORDER — ESMOLOL HCL 100 MG/10ML IV SOLN
INTRAVENOUS | Status: DC | PRN
  Administered 2017-08-25 (×2): 20 mg via INTRAVENOUS

## 2017-08-25 MED ORDER — CLINDAMYCIN PHOSPHATE 600 MG/50ML IV SOLN
600.0000 mg | Freq: Once | INTRAVENOUS | Status: AC
  Administered 2017-08-25: 600 mg via INTRAVENOUS
  Filled 2017-08-25: qty 50

## 2017-08-25 MED ORDER — ACETAMINOPHEN 10 MG/ML IV SOLN
INTRAVENOUS | Status: AC
  Filled 2017-08-25: qty 100

## 2017-08-25 MED ORDER — BUPIVACAINE-EPINEPHRINE 0.5% -1:200000 IJ SOLN
INTRAMUSCULAR | Status: DC | PRN
  Administered 2017-08-25: 30 mL

## 2017-08-25 MED ORDER — SCOPOLAMINE 1 MG/3DAYS TD PT72
1.0000 | MEDICATED_PATCH | TRANSDERMAL | Status: DC
  Administered 2017-08-25: 1.5 mg via TRANSDERMAL

## 2017-08-25 MED ORDER — PROPOFOL 10 MG/ML IV BOLUS
INTRAVENOUS | Status: AC
  Filled 2017-08-25: qty 20

## 2017-08-25 MED ORDER — LIDOCAINE 2% (20 MG/ML) 5 ML SYRINGE
INTRAMUSCULAR | Status: DC | PRN
  Administered 2017-08-25: 60 mg via INTRAVENOUS

## 2017-08-25 MED ORDER — SODIUM CHLORIDE 0.9 % IV BOLUS (SEPSIS)
1000.0000 mL | Freq: Once | INTRAVENOUS | Status: AC
  Administered 2017-08-25: 1000 mL via INTRAVENOUS

## 2017-08-25 MED ORDER — ROCURONIUM BROMIDE 10 MG/ML (PF) SYRINGE
PREFILLED_SYRINGE | INTRAVENOUS | Status: DC | PRN
  Administered 2017-08-25: 20 mg via INTRAVENOUS
  Administered 2017-08-25: 60 mg via INTRAVENOUS
  Administered 2017-08-25: 20 mg via INTRAVENOUS

## 2017-08-25 MED ORDER — ONDANSETRON HCL 4 MG/2ML IJ SOLN
INTRAMUSCULAR | Status: DC | PRN
  Administered 2017-08-25: 4 mg via INTRAVENOUS

## 2017-08-25 MED ORDER — OXYBUTYNIN CHLORIDE 5 MG PO TABS: 5.0000 mg | ORAL_TABLET | Freq: Three times a day (TID) | ORAL | Status: DC | PRN

## 2017-08-25 MED ORDER — HYDROMORPHONE HCL 1 MG/ML IJ SOLN
0.2500 mg | INTRAMUSCULAR | Status: DC | PRN
  Administered 2017-08-25 (×2): 0.5 mg via INTRAVENOUS

## 2017-08-25 MED ORDER — PROMETHAZINE HCL 25 MG/ML IJ SOLN: 6.2500 mg | INTRAMUSCULAR | Status: DC | PRN

## 2017-08-25 MED ORDER — SCOPOLAMINE 1 MG/3DAYS TD PT72
MEDICATED_PATCH | TRANSDERMAL | Status: AC
  Filled 2017-08-25: qty 1

## 2017-08-25 MED ORDER — DEXAMETHASONE SODIUM PHOSPHATE 10 MG/ML IJ SOLN
INTRAMUSCULAR | Status: DC | PRN
  Administered 2017-08-25: 5 mg via INTRAVENOUS

## 2017-08-25 MED ORDER — LORAZEPAM 0.5 MG PO TABS
0.5000 mg | ORAL_TABLET | Freq: Every evening | ORAL | Status: DC | PRN
  Administered 2017-08-26: 0.5 mg via ORAL
  Filled 2017-08-25: qty 1

## 2017-08-25 MED ORDER — ESMOLOL HCL 100 MG/10ML IV SOLN
INTRAVENOUS | Status: AC
  Filled 2017-08-25: qty 10

## 2017-08-25 MED ORDER — MORPHINE SULFATE (PF) 4 MG/ML IV SOLN: 2.0000 mg | INTRAVENOUS | Status: DC | PRN

## 2017-08-25 SURGICAL SUPPLY — 69 items
ADH SKN CLS APL DERMABOND .7 (GAUZE/BANDAGES/DRESSINGS) ×1
AGENT HMST KT MTR STRL THRMB (HEMOSTASIS) ×1
AGENT HMST MTR 8 SURGIFLO (HEMOSTASIS) ×1
APL ESCP 34 STRL LF DISP (HEMOSTASIS) ×1
APL SRG 38 LTWT LNG FL B (MISCELLANEOUS)
APPLICATOR ARISTA FLEXITIP XL (MISCELLANEOUS) IMPLANT
APPLICATOR SURGIFLO ENDO (HEMOSTASIS) ×1 IMPLANT
BAG SPEC RTRVL LRG 6X4 10 (ENDOMECHANICALS) ×1
CHLORAPREP W/TINT 26ML (MISCELLANEOUS) ×2 IMPLANT
CLIP VESOLOCK LG 6/CT PURPLE (CLIP) ×2 IMPLANT
CLIP VESOLOCK MED LG 6/CT (CLIP) ×4 IMPLANT
CLIP VESOLOCK XL 6/CT (CLIP) IMPLANT
COVER SURGICAL LIGHT HANDLE (MISCELLANEOUS) ×2 IMPLANT
COVER TIP SHEARS 8 DVNC (MISCELLANEOUS) ×1 IMPLANT
COVER TIP SHEARS 8MM DA VINCI (MISCELLANEOUS) ×2
DECANTER SPIKE VIAL GLASS SM (MISCELLANEOUS) ×2 IMPLANT
DERMABOND ADVANCED (GAUZE/BANDAGES/DRESSINGS) ×1
DERMABOND ADVANCED .7 DNX12 (GAUZE/BANDAGES/DRESSINGS) ×1 IMPLANT
DRAIN CHANNEL 15F RND FF 3/16 (WOUND CARE) ×2 IMPLANT
DRAPE ARM DVNC X/XI (DISPOSABLE) ×4 IMPLANT
DRAPE COLUMN DVNC XI (DISPOSABLE) ×1 IMPLANT
DRAPE DA VINCI XI ARM (DISPOSABLE) ×4
DRAPE DA VINCI XI COLUMN (DISPOSABLE) ×1
DRAPE INCISE IOBAN 66X45 STRL (DRAPES) ×2 IMPLANT
DRAPE SHEET LG 3/4 BI-LAMINATE (DRAPES) ×2 IMPLANT
DRSG TEGADERM 4X4.75 (GAUZE/BANDAGES/DRESSINGS) ×2 IMPLANT
ELECT PENCIL ROCKER SW 15FT (MISCELLANEOUS) ×2 IMPLANT
ELECT REM PT RETURN 15FT ADLT (MISCELLANEOUS) ×3 IMPLANT
EVACUATOR SILICONE 100CC (DRAIN) ×2 IMPLANT
GLOVE BIO SURGEON STRL SZ 6.5 (GLOVE) ×2 IMPLANT
GLOVE BIOGEL M STRL SZ7.5 (GLOVE) ×6 IMPLANT
GOWN STRL REUS W/ TWL XL LVL3 (GOWN DISPOSABLE) IMPLANT
GOWN STRL REUS W/TWL LRG LVL3 (GOWN DISPOSABLE) ×7 IMPLANT
GOWN STRL REUS W/TWL XL LVL3 (GOWN DISPOSABLE) ×6
HEMOSTAT ARISTA ABSORB 3G PWDR (MISCELLANEOUS) IMPLANT
IRRIG SUCT STRYKERFLOW 2 WTIP (MISCELLANEOUS) ×2
IRRIGATION SUCT STRKRFLW 2 WTP (MISCELLANEOUS) IMPLANT
KIT BASIN OR (CUSTOM PROCEDURE TRAY) ×2 IMPLANT
LOOP VESSEL MAXI BLUE (MISCELLANEOUS) IMPLANT
MARKER SKIN DUAL TIP RULER LAB (MISCELLANEOUS) ×2 IMPLANT
NS IRRIG 1000ML POUR BTL (IV SOLUTION) ×2 IMPLANT
PAD POSITIONING PINK XL (MISCELLANEOUS) ×2 IMPLANT
PORT ACCESS TROCAR AIRSEAL 12 (TROCAR) ×1 IMPLANT
PORT ACCESS TROCAR AIRSEAL 5M (TROCAR) ×1
POSITIONER SURGICAL ARM (MISCELLANEOUS) ×4 IMPLANT
POUCH SPECIMEN RETRIEVAL 10MM (ENDOMECHANICALS) ×2 IMPLANT
SEAL CANN UNIV 5-8 DVNC XI (MISCELLANEOUS) ×4 IMPLANT
SEAL XI 5MM-8MM UNIVERSAL (MISCELLANEOUS) ×4
SET TRI-LUMEN FLTR TB AIRSEAL (TUBING) ×2 IMPLANT
SOLUTION ELECTROLUBE (MISCELLANEOUS) ×2 IMPLANT
SPOGE SURGIFLO 8M (HEMOSTASIS) ×1
SPONGE SURGIFLO 8M (HEMOSTASIS) IMPLANT
SURGIFLO W/THROMBIN 8M KIT (HEMOSTASIS) ×2 IMPLANT
SUT ETHILON 3 0 PS 1 (SUTURE) ×2 IMPLANT
SUT MNCRL AB 4-0 PS2 18 (SUTURE) ×4 IMPLANT
SUT V-LOC BARB 180 2/0GR6 GS22 (SUTURE) ×2
SUT VIC AB 0 CT1 27 (SUTURE) ×2
SUT VIC AB 0 CT1 27XBRD ANTBC (SUTURE) ×1 IMPLANT
SUT VICRYL 0 UR6 27IN ABS (SUTURE) IMPLANT
SUT VLOC BARB 180 ABS3/0GR12 (SUTURE) ×6
SUTURE V-LC BRB 180 2/0GR6GS22 (SUTURE) ×1 IMPLANT
SUTURE VLOC BRB 180 ABS3/0GR12 (SUTURE) ×1 IMPLANT
TOWEL OR 17X26 10 PK STRL BLUE (TOWEL DISPOSABLE) ×2 IMPLANT
TOWEL OR NON WOVEN STRL DISP B (DISPOSABLE) ×2 IMPLANT
TRAY FOLEY W/METER SILVER 16FR (SET/KITS/TRAYS/PACK) ×2 IMPLANT
TRAY LAPAROSCOPIC (CUSTOM PROCEDURE TRAY) ×2 IMPLANT
TROCAR BLADELESS OPT 5 100 (ENDOMECHANICALS) IMPLANT
TROCAR XCEL 12X100 BLDLESS (ENDOMECHANICALS) IMPLANT
WATER STERILE IRR 1000ML POUR (IV SOLUTION) ×2 IMPLANT

## 2017-08-25 NOTE — Progress Notes (Signed)
Sodium 135 - 145 mmol/L 138  139   Potassium 3.5 - 5.1 mmol/L 3.3 Abnormally low   3.6   Chloride 101 - 111 mmol/L 103  103   CO2 22 - 32 mmol/L 24  26   Glucose, Bld 65 - 99 mg/dL 136 Abnormally high   97   BUN 6 - 20 mg/dL 12  20   Creatinine, Ser 0.44 - 1.00 mg/dL 1.12 Abnormally high   1.04 Abnormally high    Calcium 8.9 - 10.3 mg/dL 8.7 Abnormally low   9.6   GFR calc non Af Amer >60 mL/min 49 Abnormally low   54 Abnormally low    GFR calc Af Amer >60 mL/min 57 Abnormally low   >60 CM  Comment: (NOTE)    HGB/HCT? 12.2/30.7

## 2017-08-25 NOTE — Op Note (Signed)
Preoperative diagnosis:  1. Left renal mass   Postoperative diagnosis:  1. same   Procedure: 1. Robotic assisted laparoscopic left partial nephrectomy  Surgeon: Ardis Hughs, MD Resident 1st assistant: Jeralyn Ruths, MD   Anesthesia: General  Complications: None  Intraoperative findings:  #1.  Largely exophytic lower pole mass, margins grossly negative. #2. Warm ischemia time 18 minutes  EBL: 150 mL's  Specimens: left renal mass   Indication: Maria Middleton is a 66 y.o. patient with left renal mass.  After reviewing the management options for treatment, he elected to proceed with the above surgical procedure(s). We have discussed the potential benefits and risks of the procedure, side effects of the proposed treatment, the likelihood of the patient achieving the goals of the procedure, and any potential problems that might occur during the procedure or recuperation. Informed consent has been obtained.  Description of procedure:  The patient was taken to the operating room and a general anesthetic was administered. The patient was given preoperative antibiotics, placed in the right modified flank position with care to pad all potential pressure points, and prepped and draped in the usual sterile fashion. Next a preoperative timeout was performed.  A site was selected on the left side of the umbilicus for placement of the camera port. This was placed using a standard modified Hassan technique with entry into the peritoneum with a  8 mm tocar. We entered the peritoneum without incident and established pneumoperitoneum. The camera was then used to inspect the abdomen and there was no evidence of any intra-abdominal injuries or other abnormalities. The remaining abdominal ports were then placed. 8 mm robotic ports were placed in the left upper quadrant, left lower quadrant, and left lateral abdominal wall, making a soft J. A 12 mm port was placed in the upper midline for  laparoscopic assistance. All ports were placed under direct vision without difficulty. The surgical cart was then docked.   Utilizing the cautery scissors, the white line of Toldt was incised allowing the colon to be mobilized medially and the plane between the mesocolon and the anterior layer of Gerota's fascia to be developed and the kidney to be exposed. The ureter and gonadal vein were identified inferiorly and the ureter was lifted anteriorly off the psoas muscle. Dissection proceeded superiorly along the gonadal vein until the renal vein was identified. The renal hilum was then carefully isolated with a combination of blunt and sharp dissection allowing the renal arterial and venous structures to be separated and isolated in preparation for renal hilar vessel clamping.  Attention turned to the kidney and the perinephric fat surrounding the renal mass was removed and the kidney was mobilized sufficiently for exposure and resection of the renal mass.   Once the renal mass was properly isolated, preparations were made for resection of the tumor. The renal artery was then clamped with a bulldog clamp. The tumor was then excised with cold scissor dissection along with an adequate visible gross margin of normal renal parenchyma. The tumor appeared to be excised without any gross violation of the tumor. The renal collecting system was not entered during removal of the tumor. A running 3-0 V-lock suture was then brought through the capsule of the kidney and run along the base of the renal defect to provide hemostasis and close any entry into the renal collecting system if present.  A total of 3 separate sutures were required to achieve adequate hemostasis and closed the collecting system.  Weck clips were used  to secure this suture outside the renal capsule at the proximal and distal ends. The bulldog clamps were then removed from the renal hilar vessel. A running 2-0 V lock suture was then used to  close the capsule of the kidney using a sliding clip technique which resulted in excellent hemostasis.  I used a small tongue of perinephric fat within the defect as a buttress.  An additional hemostatic agent (Surgiflo) was then placed into the renal defect.   Total warm renal ischemia time was 18 minutes. The renal tumor resection site was examined. Hemostasis appeared adequate.   The kidney was placed back into its normal anatomic position and covered with perinephric fat as needed. A # 16 Blake drain was then brought through the lateral lower port site and positioned in the perinephric space. It was secured to the skin with a nylon suture. The surgical robotic cart was undocked. The renal tumor specimen was removed intact within an endopouch retrieval bag via the camera port sites. The camera port site and the other 12 mm port site were then closed at the fascial level with 0-vicryl suture. All other laparoscopic/robotic ports were removed under direct vision and the pneumoperitoneum let down with inspection of the operative field performed and hemostasis again confirmed. All incision sites were then injected with local anesthetic and reapproximated at the skin level with 4-0 monocryl subcuticular closures. Dermabond was applied to the skin. The patient tolerated the procedure well and without complications. The patient was able to be extubated and transferred to the recovery unit in satisfactory condition.  Ardis Hughs, M.D.

## 2017-08-25 NOTE — Progress Notes (Signed)
1349 and 1355, IV Dilaudid adminitered IV by Mia [place], per [facility]

## 2017-08-25 NOTE — Anesthesia Postprocedure Evaluation (Signed)
Anesthesia Post Note  Patient: Maria Middleton  Procedure(s) Performed: XI ROBOTIC ASSITED LEFT PARTIAL NEPHRECTOMY (Left )     Patient location during evaluation: PACU Anesthesia Type: General Level of consciousness: sedated Pain management: pain level controlled Vital Signs Assessment: post-procedure vital signs reviewed and stable Respiratory status: spontaneous breathing and respiratory function stable Cardiovascular status: stable Postop Assessment: no apparent nausea or vomiting Anesthetic complications: no    Last Vitals:  Vitals:   08/25/17 1315 08/25/17 1330  BP: 132/87   Pulse: 71 81  Resp: 15 19  Temp: (!) 36.3 C   SpO2: 99% 100%    Last Pain:  Vitals:   08/25/17 1330  TempSrc:   PainSc: 7                  Baljit Liebert DANIEL

## 2017-08-25 NOTE — Transfer of Care (Signed)
Immediate Anesthesia Transfer of Care Note  Patient: Maria Middleton  Procedure(s) Performed: XI ROBOTIC ASSITED LEFT PARTIAL NEPHRECTOMY (Left )  Patient Location: PACU  Anesthesia Type:General  Level of Consciousness: sedated, patient cooperative and responds to stimulation  Airway & Oxygen Therapy: Patient Spontanous Breathing and Patient connected to face mask oxygen  Post-op Assessment: Report given to RN and Post -op Vital signs reviewed and stable  Post vital signs: Reviewed and stable  Last Vitals:  Vitals:   08/25/17 0539 08/25/17 0541  BP: (!) 173/103 (!) 154/108  Pulse: 78   Resp: 16   Temp: 36.8 C   SpO2: 100%     Last Pain:  Vitals:   08/25/17 0539  TempSrc: Oral      Patients Stated Pain Goal: 4 (45/40/98 1191)  Complications: No apparent anesthesia complications

## 2017-08-25 NOTE — Anesthesia Procedure Notes (Signed)
Procedure Name: Intubation Performed by: Gean Maidens, CRNA Pre-anesthesia Checklist: Patient identified, Emergency Drugs available, Suction available, Patient being monitored and Timeout performed Patient Re-evaluated:Patient Re-evaluated prior to induction Oxygen Delivery Method: Circle system utilized Preoxygenation: Pre-oxygenation with 100% oxygen Induction Type: IV induction Ventilation: Mask ventilation without difficulty Laryngoscope Size: Mac and 3 Grade View: Grade I Tube type: Oral Tube size: 7.0 mm Number of attempts: 1 Airway Equipment and Method: Stylet Placement Confirmation: ETT inserted through vocal cords under direct vision,  positive ETCO2,  CO2 detector and breath sounds checked- equal and bilateral Secured at: 21 cm Tube secured with: Tape Dental Injury: Teeth and Oropharynx as per pre-operative assessment

## 2017-08-25 NOTE — Progress Notes (Signed)
Resumed care from [facility] from [facility]

## 2017-08-25 NOTE — H&P (Signed)
Renal Mass Surveillance  HPI: Maria Middleton is a 67 year-old female established patient who is here ongoing eval and management of a renal mass.  The mass is on the left side.   The lesion(s) was first noted on 05/04/2017. The mass was seen on CT Scan.   The mass was last imaged 8 months ago. The area was described as Left lower pole 2.3 cm. The mass is stable in appearance since last imaging study.   Her symptoms include back pain. Patient denies having flank pain, groin pain, nausea, vomiting, fever, chills, and blood in urine. She has not seen blood in her urine. She does have a good appetite. She is having pain in new locations. She has not recently had unwanted weight loss.   She has not had previous abdominal surgery. The patient cannot walk a flight of steps.   The patient denies history of diabetes, heart attack or stroke. There is not a a family history of kidney cancer. There is no family history of brain tumors (AMLs), seizures or brain aneurysm's.     ALLERGIES: Sulfa Drugs    MEDICATIONS: Ultram 50 mg tablet 1-2 tablet PO Q 6 H  Fish Oil  Garlic  Lorazepam 0.5 mg tablet  Losartan Potassium  Vitamin C     GU PSH: Hysterectomy, 1995      PSH Notes: Broken ankle surgery (2017)   NON-GU PSH: None   GU PMH: Left uncertain neoplasm of kidney - 05/04/2017    NON-GU PMH: Anxiety Depression GERD Hypertension    FAMILY HISTORY: 3 Son's - Son Aneurysm - Mother Heart Attack - Father leukemia - Brother stroke - Father   SOCIAL HISTORY: Marital Status: Single Preferred Language: English; Ethnicity: Not Hispanic Or Latino; Race: Black or African American Current Smoking Status: Patient has never smoked.   Tobacco Use Assessment Completed: Used Tobacco in last 30 days? Does drink.  Patient's occupation is/was Retired.     Notes: Caffeine: 2-3 a week   REVIEW OF SYSTEMS:    GU Review Female:   Patient reports frequent urination, hard to postpone urination,  and get up at night to urinate. Patient denies burning /pain with urination, leakage of urine, stream starts and stops, trouble starting your stream, have to strain to urinate, and being pregnant.  Gastrointestinal (Upper):   Patient denies nausea, vomiting, and indigestion/ heartburn.  Gastrointestinal (Lower):   Patient reports constipation. Patient denies diarrhea.  Constitutional:   Patient reports night sweats. Patient denies fever, weight loss, and fatigue.  Skin:   Patient denies skin rash/ lesion and itching.  Eyes:   Patient denies blurred vision and double vision.  Ears/ Nose/ Throat:   Patient reports sinus problems. Patient denies sore throat.  Hematologic/Lymphatic:   Patient denies swollen glands and easy bruising.  Cardiovascular:   Patient denies leg swelling and chest pains.  Respiratory:   Patient denies cough and shortness of breath.  Endocrine:   Patient denies excessive thirst.  Musculoskeletal:   Patient reports back pain. Patient denies joint pain.  Neurological:   Patient reports headaches. Patient denies dizziness.  Psychologic:   Patient denies depression and anxiety.   VITAL SIGNS:      07/06/2017 04:11 PM  Weight 164 lb / 74.39 kg  Height 63 in / 160.02 cm  BP 125/86 mmHg  Pulse 77 /min  BMI 29.0 kg/m   MULTI-SYSTEM PHYSICAL EXAMINATION:    Constitutional: Well-nourished. No physical deformities. Normally developed. Good grooming.  Respiratory: No labored breathing,  no use of accessory muscles.   Cardiovascular: Normal temperature, normal extremity pulses, no swelling, no varicosities.  Gastrointestinal: No mass, no tenderness, no rigidity, non obese abdomen. No CVA tenderness, soft abdomen     PAST DATA REVIEWED:  Source Of History:  Patient  Records Review:   Previous Doctor Records, Previous Hospital Records, Previous Patient Records   PROCEDURES:          Urinalysis - 81003 Dipstick Dipstick Cont'd  Color: Yellow Bilirubin: Neg  Appearance:  Clear Ketones: Neg  Specific Gravity: 1.010 Blood: Neg  pH: 6.5 Protein: Neg  Glucose: Neg Urobilinogen: 0.2    Nitrites: Neg    Leukocyte Esterase: Neg    Notes:      ASSESSMENT:      ICD-10 Details  1 GU:   Benign Neo Kidney, Unspec - D30.00    PLAN:           Document Letter(s):  Created for Patient: Clinical Summary    The patient has been given the natural history of renal cancer, treatment options, and I recommended surgical extirpation for this patient. I went over the robotic-assisted laparoscopic partial nephrectomy approach. I described for the patient the procedure in detail including port placement. I detailed the postoperative course including the fact that the patient would have both a drain and a Foley catheter following the surgery. I told the patient that most often patients are discharged on postoperative day one or 2. I then detailed the expected recovery time, I told the patient that he would not be able to lift anything greater than 20 pounds for 4 weeks. I also went over the risks and benefits of this operation in great detail. We discussed the risk of injury to surrounding structures, major blood vessels and nerves, bleeding, infection, loss of kidney, and the risk of recurrent cancer.         Notes:   At this point, the patient and I went over all the treatment options and the risks and the benefits of each. I recommended treatment, specifically I recommended robotic partial nephrectomy as I think that she will tolerate this procedure well given her otherwise overall good health. She will think about this, contact us with a decision.

## 2017-08-26 DIAGNOSIS — Z79899 Other long term (current) drug therapy: Secondary | ICD-10-CM | POA: Diagnosis not present

## 2017-08-26 DIAGNOSIS — R2681 Unsteadiness on feet: Secondary | ICD-10-CM | POA: Diagnosis not present

## 2017-08-26 DIAGNOSIS — Z880 Allergy status to penicillin: Secondary | ICD-10-CM | POA: Diagnosis not present

## 2017-08-26 DIAGNOSIS — Z7982 Long term (current) use of aspirin: Secondary | ICD-10-CM | POA: Diagnosis not present

## 2017-08-26 DIAGNOSIS — Z882 Allergy status to sulfonamides status: Secondary | ICD-10-CM | POA: Diagnosis not present

## 2017-08-26 DIAGNOSIS — C642 Malignant neoplasm of left kidney, except renal pelvis: Secondary | ICD-10-CM | POA: Diagnosis not present

## 2017-08-26 DIAGNOSIS — K219 Gastro-esophageal reflux disease without esophagitis: Secondary | ICD-10-CM | POA: Diagnosis not present

## 2017-08-26 DIAGNOSIS — I10 Essential (primary) hypertension: Secondary | ICD-10-CM | POA: Diagnosis not present

## 2017-08-26 LAB — BASIC METABOLIC PANEL
Anion gap: 6 (ref 5–15)
BUN: 11 mg/dL (ref 6–20)
CO2: 26 mmol/L (ref 22–32)
Calcium: 8.5 mg/dL — ABNORMAL LOW (ref 8.9–10.3)
Chloride: 104 mmol/L (ref 101–111)
Creatinine, Ser: 1.09 mg/dL — ABNORMAL HIGH (ref 0.44–1.00)
GFR calc Af Amer: 59 mL/min — ABNORMAL LOW (ref >60)
GFR calc non Af Amer: 51 mL/min — ABNORMAL LOW (ref >60)
Glucose, Bld: 91 mg/dL (ref 65–99)
POTASSIUM: 3.7 mmol/L (ref 3.5–5.1)
Sodium: 136 mmol/L (ref 135–145)

## 2017-08-26 LAB — CBC
HEMATOCRIT: 34.5 % — AB (ref 36.0–46.0)
Hemoglobin: 11.7 g/dL — ABNORMAL LOW (ref 12.0–15.0)
MCH: 31.3 pg (ref 26.0–34.0)
MCHC: 33.9 g/dL (ref 30.0–36.0)
MCV: 92.2 fL (ref 78.0–100.0)
Platelets: 166 10*3/uL (ref 150–400)
RBC: 3.74 MIL/uL — ABNORMAL LOW (ref 3.87–5.11)
RDW: 14.6 % (ref 11.5–15.5)
WBC: 7 10*3/uL (ref 4.0–10.5)

## 2017-08-26 MED ORDER — SENNA 8.6 MG PO TABS: 1.0000 | ORAL_TABLET | Freq: Every day | ORAL | 0 refills | Status: AC

## 2017-08-26 MED ORDER — ACETAMINOPHEN 10 MG/ML IV SOLN
1000.0000 mg | Freq: Four times a day (QID) | INTRAVENOUS | Status: DC
  Administered 2017-08-26 (×2): 1000 mg via INTRAVENOUS
  Filled 2017-08-26 (×4): qty 100

## 2017-08-26 MED ORDER — TRAMADOL HCL 50 MG PO TABS
50.0000 mg | ORAL_TABLET | Freq: Four times a day (QID) | ORAL | Status: DC | PRN
  Administered 2017-08-26: 100 mg via ORAL
  Filled 2017-08-26: qty 2

## 2017-08-26 MED ORDER — ALUM & MAG HYDROXIDE-SIMETH 200-200-20 MG/5ML PO SUSP
15.0000 mL | Freq: Four times a day (QID) | ORAL | Status: DC | PRN
  Administered 2017-08-27: 15 mL via ORAL
  Filled 2017-08-26: qty 30

## 2017-08-26 MED ORDER — TRAMADOL HCL 50 MG PO TABS: 50.0000 mg | ORAL_TABLET | Freq: Four times a day (QID) | ORAL | 0 refills | Status: DC | PRN

## 2017-08-26 MED ORDER — DOCUSATE SODIUM 100 MG PO CAPS: 200.0000 mg | ORAL_CAPSULE | Freq: Two times a day (BID) | ORAL | 11 refills | Status: AC

## 2017-08-26 MED ORDER — ACETAMINOPHEN 325 MG PO TABS: 650.0000 mg | ORAL_TABLET | Freq: Four times a day (QID) | ORAL | Status: DC | PRN

## 2017-08-26 MED ORDER — HYDROCODONE-ACETAMINOPHEN 5-325 MG PO TABS: 1.0000 | ORAL_TABLET | ORAL | Status: DC | PRN

## 2017-08-26 NOTE — Progress Notes (Signed)
Urology Inpatient Progress Report  left renal mass  Procedure(s): XI ROBOTIC ASSITED LEFT PARTIAL NEPHRECTOMY  1 Day Post-Op   Intv/Subj: No acute events overnight. Patient is without complaint. Mild discomfort in right abdomen (opposite of surgical side)  Active Problems:   Renal mass  Current Facility-Administered Medications  Medication Dose Route Frequency Provider Last Rate Last Dose  . 0.45 % sodium chloride infusion   Intravenous Continuous Stasia Cavalier, MD 100 mL/hr at 08/25/17 1450    . LORazepam (ATIVAN) tablet 0.5 mg  0.5 mg Oral QHS PRN Stasia Cavalier, MD      . morphine 4 MG/ML injection 2-4 mg  2-4 mg Intravenous Q2H PRN Stasia Cavalier, MD      . oxybutynin (DITROPAN) tablet 5 mg  5 mg Oral Q8H PRN Stasia Cavalier, MD      . oxyCODONE (Oxy IR/ROXICODONE) immediate release tablet 5 mg  5 mg Oral Q4H PRN Stasia Cavalier, MD   5 mg at 08/26/17 0556  . senna-docusate (Senokot-S) tablet 2 tablet  2 tablet Oral QHS Stasia Cavalier, MD   2 tablet at 08/25/17 2056     Objective: Vital: Vitals:   08/25/17 1446 08/25/17 1900 08/25/17 2130 08/26/17 0611  BP:  (!) 142/68 125/63 126/74  Pulse:  74 64 70  Resp:  18 18 16   Temp:  98.9 F (37.2 C) 98.5 F (36.9 C) 99.3 F (37.4 C)  TempSrc:  Oral Oral Oral  SpO2:  100% 96% 97%  Weight: 74.6 kg (164 lb 7.4 oz)     Height: 5\' 3"  (1.6 m)      I/Os: I/O last 3 completed shifts: In: 4666.7 [I.V.:3016.7; IV Piggyback:1650] Out: 1610 [Urine:1425; Drains:40; Blood:150]  Physical Exam:  General: Patient is in no apparent distress Lungs: Normal respiratory effort, chest expands symmetrically. GI: Incisions are c/d/i.The abdomen is soft/appropriately tender JP drain with serosanguinous drainage Foley: serosanguinous fluid Ext: lower extremities symmetric  Lab Results: Recent Labs    08/25/17 1143 08/26/17 0348  WBC  --  7.0  HGB 12.2 11.7*  HCT 35.7* 34.5*   Recent Labs    08/25/17 1143 08/26/17 0348  NA 138 136  K 3.3* 3.7  CL 103 104  CO2 24 26  GLUCOSE 136* 91  BUN 12 11  CREATININE 1.12* 1.09*  CALCIUM 8.7* 8.5*   No results for input(s): LABPT, INR in the last 72 hours. No results for input(s): LABURIN in the last 72 hours. No results found for this or any previous visit.  Studies/Results: No results found.  Assessment: Procedure(s): XI ROBOTIC ASSITED LEFT PARTIAL NEPHRECTOMY, 1 Day Post-Op  doing well.  Plan: Transition to oral medications D/c foley catheter Remove drain if <20cc three hours after foley catheter is removed. ADAT Patient should ambulate and spend most of her day in the chair  Consider discharge this afternoon.   Louis Meckel, MD Urology 08/26/2017, 7:33 AM

## 2017-08-26 NOTE — Evaluation (Signed)
Physical Therapy Evaluation Patient Details Name: Maria Middleton MRN: [id number] DOB: [birth year] Today's Date: 08/26/2017   History of Present Illness  Maria Middleton is a 67 year-old female established patient who is here ongoing eval and management of a renal mass. s/p left partial nephrectomy 08/25/17.    Clinical Impression  Patient is a 68 year old female s/p renal mass removal surgery. Patient continues to be a bit lethargic and unsteady post op day 1. Patient exhibits deficits in strength, endurance, balance, coordination, functional mobility and gait skills at this time. Patient will benefit from a [facility] to provide stability during upright activities while recovering at home or her son's apartment.  Patient would continue to benefit from skilled physical therapy in current environment to continue return to prior function and increase strength, endurance, balance, coordination, and functional mobility and gait skills.      Follow Up Recommendations No PT follow up    Equipment Recommendations  Cane(SPC)    Recommendations for Other Services       Precautions / Restrictions Precautions Precautions: Fall Precaution Comments: patient still a bit lethargic and unsteady post op Restrictions Weight Bearing Restrictions: No      Mobility  Bed Mobility Overal bed mobility: Modified Independent                Transfers Overall transfer level: Modified independent Equipment used: 1 person hand held assist                Ambulation/Gait Ambulation/Gait assistance: Min guard Ambulation Distance (Feet): 100 Feet Assistive device: (pushed IV pole with two hands; refused one hand) Gait Pattern/deviations: Step-through pattern;Decreased step length - right;Decreased step length - left;Decreased stride length   Gait velocity interpretation: Below normal speed for age/gender General Gait Details: slow pace  Stairs            Wheelchair Mobility     Modified Rankin (Stroke Patients Only)       Balance Overall balance assessment: Mild deficits observed, not formally tested                                           Pertinent Vitals/Pain Pain Assessment: Faces Faces Pain Scale: Hurts a little bit Pain Location: right shoulder and incision site    Home Living Family/patient expects to be discharged to:: Private residence Living Arrangements: Other relatives;Non-relatives/Friends(homemate and grandson intermittently) Available Help at Discharge: Family(patient may go stay with son in [place] for a few days) Type of Home: House Home Access: Stairs to enter Entrance Stairs-Rails: None Entrance Stairs-Number of Steps: 2 Home Layout: Two level;Able to live on main level with bedroom/bathroom(no tub; shower on 2nd level)        Prior Function Level of Independence: Independent               Hand Dominance        Extremity/Trunk Assessment        Lower Extremity Assessment Lower Extremity Assessment: Overall WFL for tasks assessed;Generalized weakness       Communication   Communication: No difficulties  Cognition Arousal/Alertness: Lethargic;Awake/alert(slow moving and c/o dizziness with activity) Behavior During Therapy: WFL for tasks assessed/performed Overall Cognitive Status: Within Functional Limits for tasks assessed  General Comments      Exercises General Exercises - Lower Extremity Long Arc Quad: Seated;AROM;Strengthening;Both;10 reps Hip Flexion/Marching: Seated;AROM;Strengthening;Both;10 reps Toe Raises: Seated;AROM;Strengthening;Both;10 reps Heel Raises: Seated;AROM;Strengthening;Both;10 reps   Assessment/Plan    PT Assessment Patient needs continued PT services  PT Problem List Decreased strength;Decreased mobility;Decreased coordination;Decreased activity tolerance;Decreased balance;Pain       PT Treatment  Interventions Gait training;Stair training;Functional mobility training;Therapeutic activities;Therapeutic exercise;Balance training;Patient/family education    PT Goals (Current goals can be found in the Care Plan section)  Acute Rehab PT Goals Patient Stated Goal: eventually return home; willing to stay with son for a few days if need to. PT Goal Formulation: With patient Time For Goal Achievement: 09/02/17 Potential to Achieve Goals: Good    Frequency Min 3X/week   Barriers to discharge        Co-evaluation               AM-PAC PT "6 Clicks" Daily Activity  Outcome Measure Difficulty turning over in bed (including adjusting bedclothes, sheets and blankets)?: A Little Difficulty moving from lying on back to sitting on the side of the bed? : A Little Difficulty sitting down on and standing up from a chair with arms (e.g., wheelchair, bedside commode, etc,.)?: A Little Help needed moving to and from a bed to chair (including a wheelchair)?: A Little Help needed walking in hospital room?: A Little Help needed climbing 3-5 steps with a railing? : A Little 6 Click Score: 18    End of Session Equipment Utilized During Treatment: Gait belt Activity Tolerance: Patient limited by fatigue;Patient tolerated treatment well Patient left: in bed;with call bell/phone within reach;with bed alarm set Nurse Communication: Mobility status PT Visit Diagnosis: Unsteadiness on feet (R26.81);Other abnormalities of gait and mobility (R26.89)    Time: 3748-2707 PT Time Calculation (min) (ACUTE ONLY): 39 min   Charges:   PT Evaluation $PT Eval Low Complexity: 1 Low PT Treatments $Therapeutic Activity: 8-22 mins   PT G Codes:        [name] D. Hartnett-Rands, MS, PT Per [facility] [phone] 08/26/2017, 12:01 PM

## 2017-08-26 NOTE — Discharge Summary (Signed)
[facility] Discharge Summary  Admit date: 08/25/2017  Discharge date and time: 08/27/17   Discharge to: Home  Discharge Service: Urology  Discharge Attending Physician:  Dr. Louis Meckel  Discharge  Diagnoses: Left renal cell carcinoma  Secondary Diagnosis:   OR Procedures: Procedure(s): XI ROBOTIC ASSITED LEFT PARTIAL NEPHRECTOMY 08/25/2017   Ancillary Procedures: None   Discharge Day Services: The patient was seen and examined by the Urology team both in the morning and immediately prior to discharge.  Vital signs and laboratory values were stable and within normal limits.  The physical exam was benign and unchanged and all surgical wounds were examined.  Discharge instructions were explained and all questions answered.  Subjective  No acute events overnight. Pain Controlled. No fever or chills.  Objective Patient Vitals for the past 8 hrs:  BP Temp Temp src Pulse Resp SpO2  08/27/17 0519 (!) 148/89 98.5 F (36.9 C) Oral 73 16 93 %   No intake/output data recorded.  General Appearance:        No acute distress Lungs:                       Normal work of breathing on room air Heart:                                Regular rate and rhythm Abdomen:                         Soft, non-tender, non-distended. Incisions c/d/i Extremities:                      Warm and well perfused   Hospital Course:  The patient underwent left robotic partial nephrectomy on 08/25/2017.  The patient tolerated the procedure well, was extubated in the OR, and afterwards was taken to the PACU for routine post-surgical care. When stable the patient was transferred to the floor.   The patient did well postoperatively.  The patient's diet was slowly advanced and at the time of discharge was tolerating a regular diet.  The patient was discharged home 2 Day Post-Op, at which point was tolerating a regular solid diet, was able to void spontaneously, have adequate pain control with P.O. pain medication, and  could ambulate without difficulty. The patient will follow up with [place] for post op check.   Condition at Discharge: Improved  Discharge Medications:  Allergies as of 08/27/2017      Reactions   Cheese    Congestion    Milk-related Compounds    Congestion    Penicillins Nausea Only   Has patient had a PCN reaction causing immediate rash, facial/tongue/throat swelling, SOB or lightheadedness with hypotension: no Has patient had a PCN reaction causing severe rash involving mucus membranes or skin necrosis: no Has patient had a PCN reaction that required hospitalization: no Has patient had a PCN reaction occurring within the last 10 years: no If all of the above answers are "NO", then may proceed with Cephalosporin use.   Sulfa Antibiotics Hives      Medication List    TAKE these medications   aspirin-acetaminophen-caffeine 250-250-65 MG tablet Commonly known as:  EXCEDRIN MIGRAINE Take 1 tablet by mouth 2 (two) times daily as needed for headache.   CALCIUM 600+D 600-800 MG-UNIT Tabs Generic drug:  Calcium Carb-Cholecalciferol Take 1 tablet by mouth daily.   Digestive Enzyme  Caps Take 1 capsule by mouth 3 (three) times daily as needed (bloating).   docusate sodium 100 MG capsule Commonly known as:  COLACE Take 2 capsules (200 mg total) by mouth 2 (two) times daily.   ECHINACEA PO Take 1 capsule by mouth daily as needed (immune support).   GAMMA AMINOBUTYRIC ACID PO Take 1 tablet by mouth at bedtime. Gaba otc supplement   GARLIC PO Take 1 tablet by mouth 3 (three) times a week.   LORazepam 0.5 MG tablet Commonly known as:  ATIVAN Take 0.5 mg at bedtime as needed by mouth for sleep.   losartan-hydrochlorothiazide 100-25 MG tablet Commonly known as:  HYZAAR Take 1 tablet by mouth daily.   MAGNESIUM PO Take 1 tablet by mouth daily. With vitamin d   multivitamin with minerals Tabs tablet Take 1 tablet by mouth daily.   OVER THE COUNTER MEDICATION Take 1 capsule by  mouth at bedtime as needed (constipation). Cleanse More herbal laxative   senna 8.6 MG Tabs tablet Commonly known as:  SENOKOT Take 1-2 tablets (8.6-17.2 mg total) by mouth at bedtime.   traMADol 50 MG tablet Commonly known as:  ULTRAM Take 1 tablet (50 mg total) by mouth every 6 (six) hours as needed for moderate pain. What changed:  when to take this   TURMERIC PO Take 1 tablet by mouth daily as needed (energy).   Vitamin C Powd Take 1 Dose by mouth daily as needed (stress).

## 2017-08-26 NOTE — Plan of Care (Signed)
  Pain Managment: General experience of comfort will improve 08/26/2017 0939 - Progressing by Zacheriah Stumpe, Royetta Crochet, RN  Pain 4/10 to abdomen right side.  Pt up to chair with one assist.  C/o dizziness upon standing.

## 2017-08-27 DIAGNOSIS — Z79899 Other long term (current) drug therapy: Secondary | ICD-10-CM | POA: Diagnosis not present

## 2017-08-27 DIAGNOSIS — Z7982 Long term (current) use of aspirin: Secondary | ICD-10-CM | POA: Diagnosis not present

## 2017-08-27 DIAGNOSIS — R2681 Unsteadiness on feet: Secondary | ICD-10-CM | POA: Diagnosis not present

## 2017-08-27 DIAGNOSIS — C642 Malignant neoplasm of left kidney, except renal pelvis: Secondary | ICD-10-CM | POA: Diagnosis not present

## 2017-08-27 DIAGNOSIS — Z882 Allergy status to sulfonamides status: Secondary | ICD-10-CM | POA: Diagnosis not present

## 2017-08-27 DIAGNOSIS — I10 Essential (primary) hypertension: Secondary | ICD-10-CM | POA: Diagnosis not present

## 2017-08-27 DIAGNOSIS — K219 Gastro-esophageal reflux disease without esophagitis: Secondary | ICD-10-CM | POA: Diagnosis not present

## 2017-08-27 DIAGNOSIS — Z880 Allergy status to penicillin: Secondary | ICD-10-CM | POA: Diagnosis not present

## 2017-08-27 NOTE — Plan of Care (Signed)
  Progressing Health Behavior/Discharge Planning: Ability to manage health-related needs will improve 08/27/2017 1135 - Progressing by Channa Hazelett, Royetta Crochet, RN Clinical Measurements: Ability to maintain clinical measurements within normal limits will improve 08/27/2017 1135 - Progressing by Kyarah Enamorado, Royetta Crochet, RN Activity: Risk for activity intolerance will decrease 08/27/2017 1135 - Progressing by Bethel Sirois, Royetta Crochet, RN Nutrition: Adequate nutrition will be maintained 08/27/2017 1135 - Progressing by Oronde Hallenbeck, Royetta Crochet, RN Coping: Level of anxiety will decrease 08/27/2017 1135 - Progressing by Yanai Hobson, Royetta Crochet, RN Elimination: Will not experience complications related to bowel motility 08/27/2017 1135 - Progressing by Evey Mcmahan, Royetta Crochet, RN Pain Managment: General experience of comfort will improve 08/27/2017 1135 - Progressing by Earleen Aoun, Royetta Crochet, RN Safety: Ability to remain free from injury will improve 08/27/2017 1135 - Progressing by Myleah Cavendish, Royetta Crochet, RN

## 2017-08-27 NOTE — Progress Notes (Signed)
Urology Inpatient Progress Report  left renal mass  Procedure(s): XI ROBOTIC ASSITED LEFT PARTIAL NEPHRECTOMY  2 Days Post-Op   Intv/Subj: No acute events overnight. Patient is without complaint. Slight headache this AM Drain and foley catheter removed yesterday Able to ambulate several times yesterday  Active Problems:   Renal mass  Current Facility-Administered Medications  Medication Dose Route Frequency Provider Last Rate Last Dose  . acetaminophen (OFIRMEV) IV 1,000 mg  1,000 mg Intravenous Q6H Ardis Hughs, MD   Stopped at 08/27/17 0016  . alum & mag hydroxide-simeth (MAALOX/MYLANTA) 200-200-20 MG/5ML suspension 15 mL  15 mL Oral Q6H PRN Bodenheimer, Charles A, NP   15 mL at 08/27/17 0017  . LORazepam (ATIVAN) tablet 0.5 mg  0.5 mg Oral QHS PRN Stasia Cavalier, MD   0.5 mg at 08/26/17 2154  . senna-docusate (Senokot-S) tablet 2 tablet  2 tablet Oral QHS Stasia Cavalier, MD   2 tablet at 08/26/17 2152  . traMADol (ULTRAM) tablet 50-100 mg  50-100 mg Oral Q6H PRN Ardis Hughs, MD   100 mg at 08/26/17 1758     Objective: Vital: Vitals:   08/26/17 1013 08/26/17 1442 08/26/17 2200 08/27/17 0519  BP: 127/68 120/77 138/80 (!) 148/89  Pulse: 74 67 67 73  Resp: 18 16 16 16   Temp: 98.5 F (36.9 C) 98.7 F (37.1 C) 97.9 F (36.6 C) 98.5 F (36.9 C)  TempSrc: Oral Oral Oral Oral  SpO2: 94% 95% 95% 93%  Weight:      Height:       I/Os: I/O last 3 completed shifts: In: 7893 [P.O.:120; I.V.:1495; IV Piggyback:100] Out: 850 [Urine:800; Drains:50]  Physical Exam:  General: Patient is in no apparent distress Lungs: Normal respiratory effort, chest expands symmetrically. GI: Incisions are c/d/i.The abdomen is soft/appropriately tender Ext: lower extremities symmetric  Lab Results: Recent Labs    08/25/17 1143 08/26/17 0348  WBC  --  7.0  HGB 12.2 11.7*  HCT 35.7* 34.5*   Recent Labs    08/25/17 1143 08/26/17 0348  NA 138 136  K 3.3*  3.7  CL 103 104  CO2 24 26  GLUCOSE 136* 91  BUN 12 11  CREATININE 1.12* 1.09*  CALCIUM 8.7* 8.5*   No results for input(s): LABPT, INR in the last 72 hours. No results for input(s): LABURIN in the last 72 hours. No results found for this or any previous visit.  Studies/Results: No results found.  Assessment: Procedure(s): XI ROBOTIC ASSITED LEFT PARTIAL NEPHRECTOMY, 2 Days Post-Op  doing well.  Plan:  Plan to give her a regular diet and if its well tolerated discharge her later this AM.    Louis Meckel, MD Urology 08/27/2017, 8:01 AM

## 2017-08-27 NOTE — Discharge Instructions (Signed)

## 2017-09-06 DIAGNOSIS — C642 Malignant neoplasm of left kidney, except renal pelvis: Secondary | ICD-10-CM | POA: Diagnosis not present

## 2017-09-16 DIAGNOSIS — C642 Malignant neoplasm of left kidney, except renal pelvis: Secondary | ICD-10-CM | POA: Diagnosis not present

## 2017-10-07 DIAGNOSIS — K5904 Chronic idiopathic constipation: Secondary | ICD-10-CM | POA: Diagnosis not present

## 2017-10-07 DIAGNOSIS — F5101 Primary insomnia: Secondary | ICD-10-CM | POA: Diagnosis not present

## 2017-10-07 DIAGNOSIS — Z85528 Personal history of other malignant neoplasm of kidney: Secondary | ICD-10-CM | POA: Diagnosis not present

## 2017-10-07 DIAGNOSIS — I10 Essential (primary) hypertension: Secondary | ICD-10-CM | POA: Diagnosis not present

## 2017-11-05 DIAGNOSIS — H25813 Combined forms of age-related cataract, bilateral: Secondary | ICD-10-CM | POA: Diagnosis not present

## 2017-11-10 DIAGNOSIS — G459 Transient cerebral ischemic attack, unspecified: Secondary | ICD-10-CM | POA: Diagnosis not present

## 2017-11-10 DIAGNOSIS — F5101 Primary insomnia: Secondary | ICD-10-CM | POA: Diagnosis not present

## 2017-11-10 DIAGNOSIS — R5383 Other fatigue: Secondary | ICD-10-CM | POA: Diagnosis not present

## 2017-11-23 DIAGNOSIS — G459 Transient cerebral ischemic attack, unspecified: Secondary | ICD-10-CM | POA: Diagnosis not present

## 2018-01-19 IMAGING — CT CT BIOPSY
1 of 2 series · 14 of 32 positions shown, 19 images · non-contrast
Comparison: none

INDICATION: 66-year-old with a right retroperitoneal lesion and a left renal
lesion. Tissue diagnosis is needed. Plan for biopsy of the
retroperitoneal lesion today.

[Series 2: i-spiral 5.0 b40f · axial · 0.90mm/px · z∈[+1056,+1248]mm · 14 of 63 slices shown, 19 images]
[im 4/63  soft-tissue]
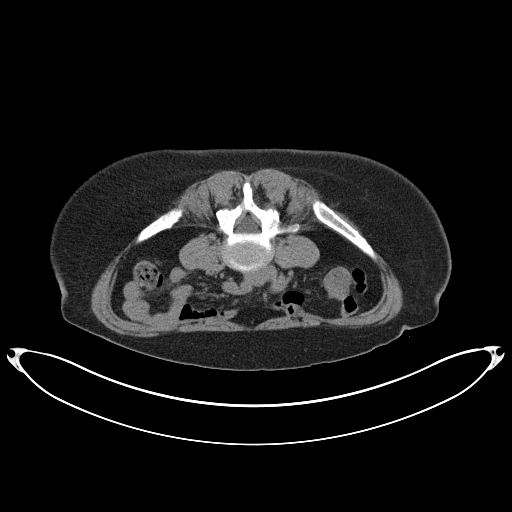
[im 4/63  bone]
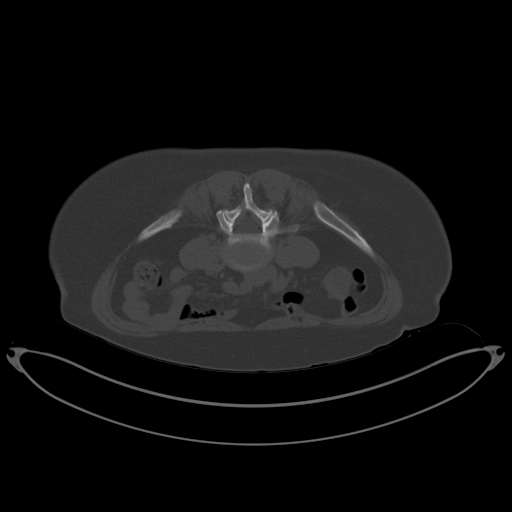
[im 10/63  soft-tissue]
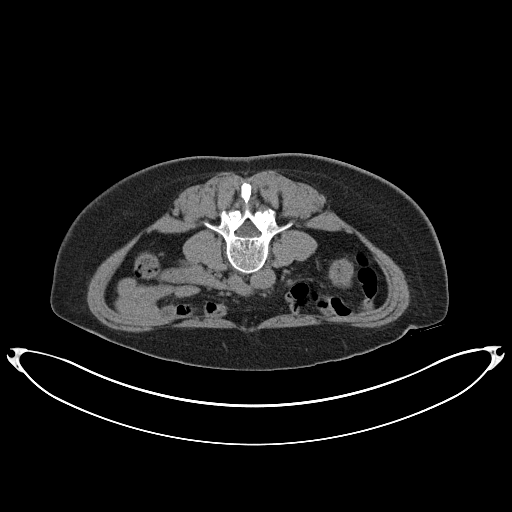
[im 14/63  soft-tissue]
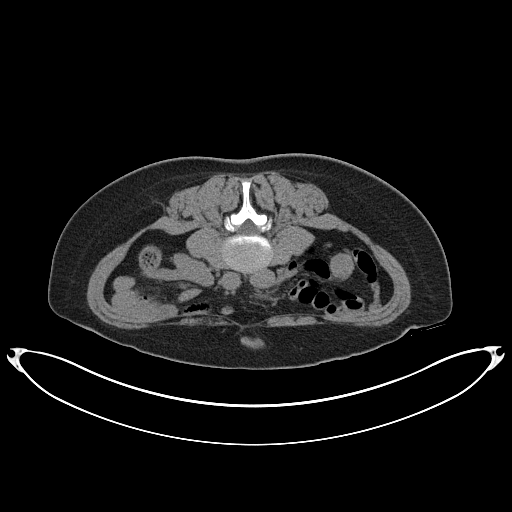
[im 17/63  soft-tissue]
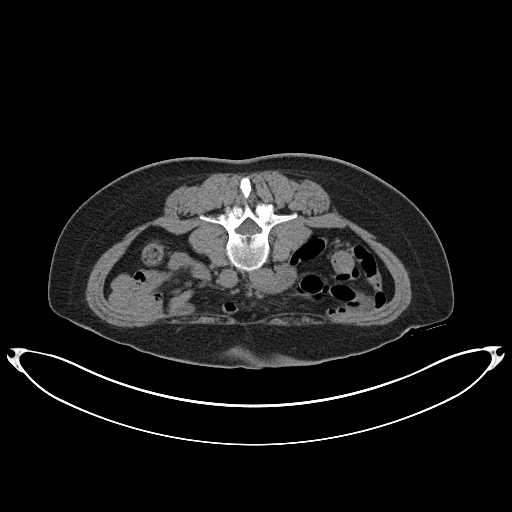
[im 23/63  soft-tissue]
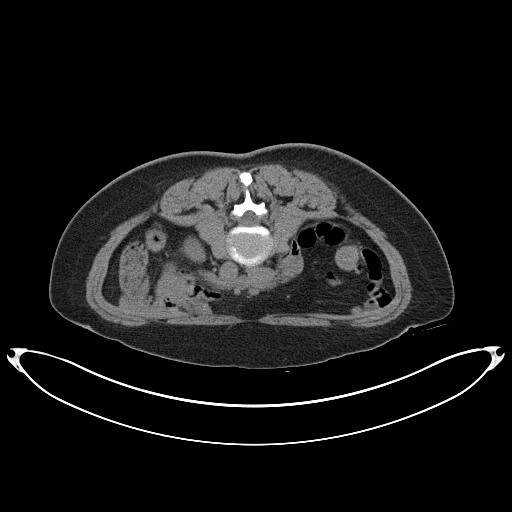
[im 27/63  soft-tissue]
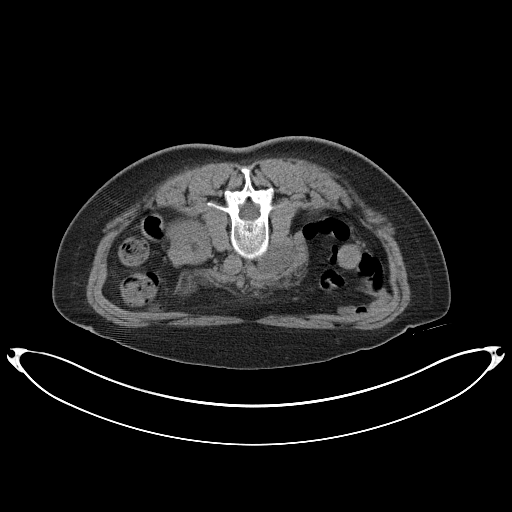
[im 33/63  soft-tissue]
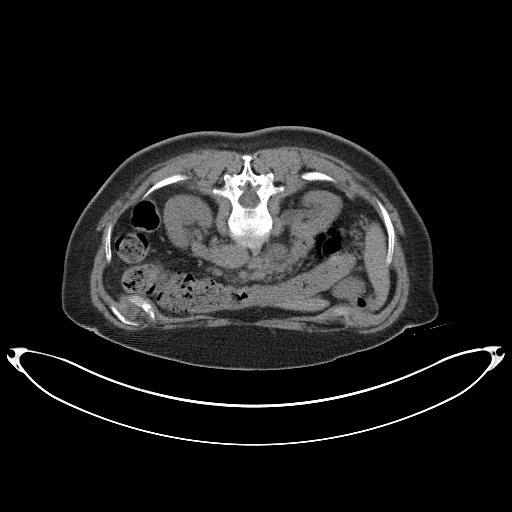
[im 36/63  soft-tissue]
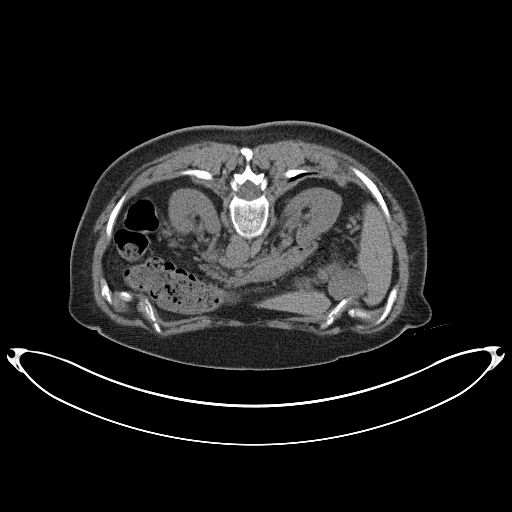
[im 40/63  soft-tissue]
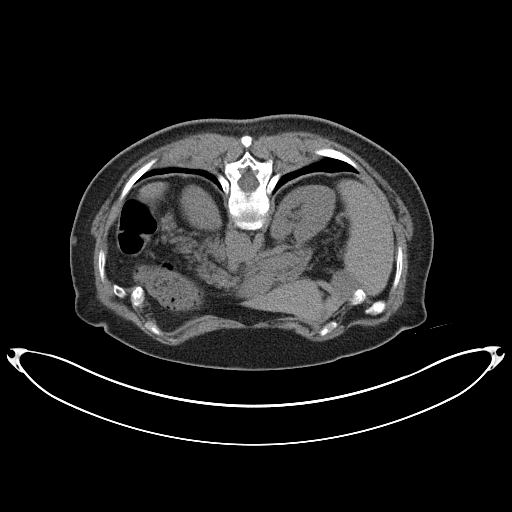
[im 40/63  bone]
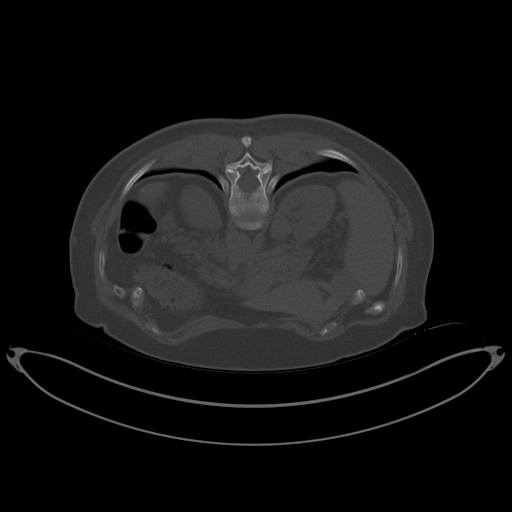
[im 46/63  soft-tissue]
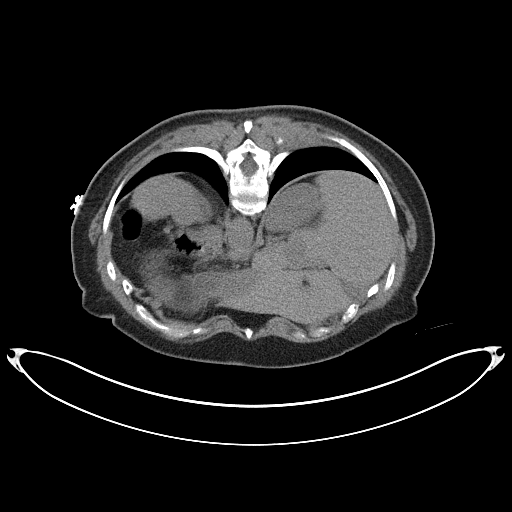
[im 49/63  soft-tissue]
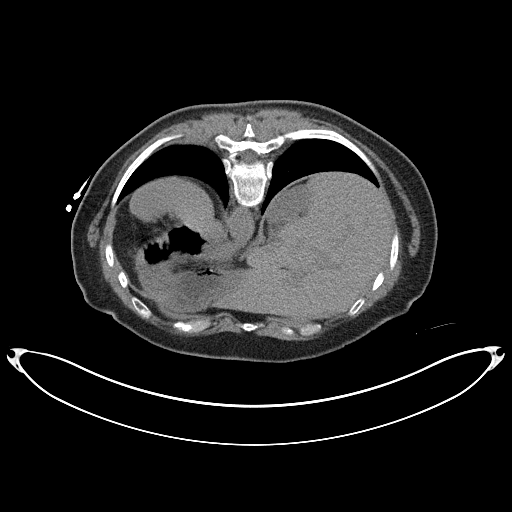
[im 49/63  lung]
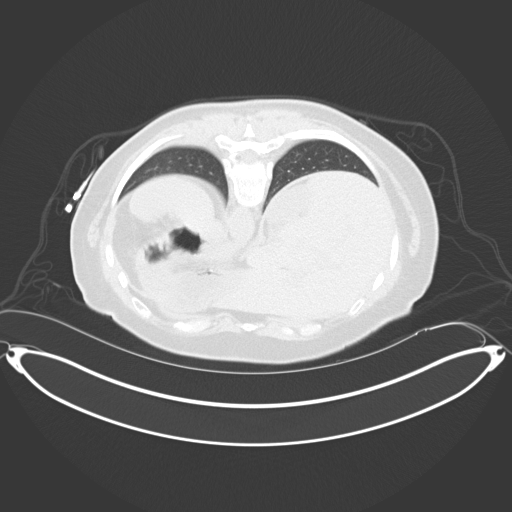
[im 53/63  soft-tissue]
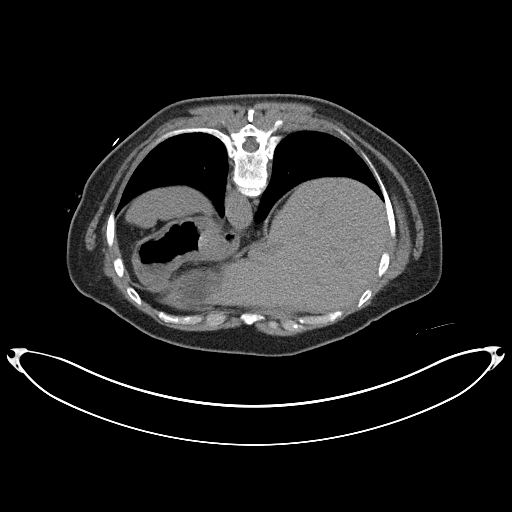
[im 53/63  lung]
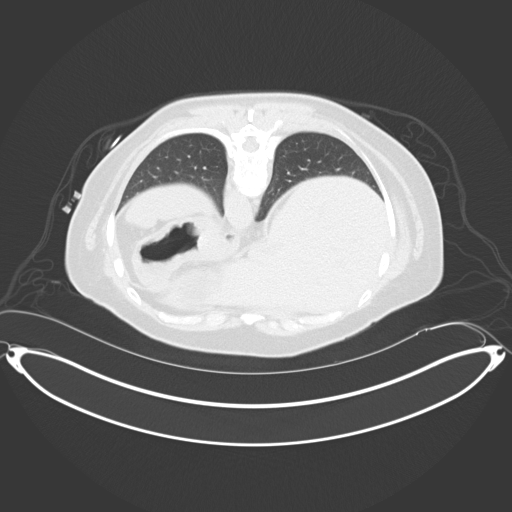
[im 56/63  lung]
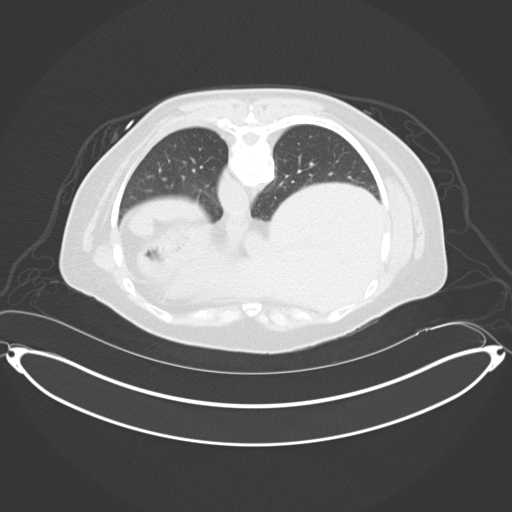
[im 59/63  soft-tissue]
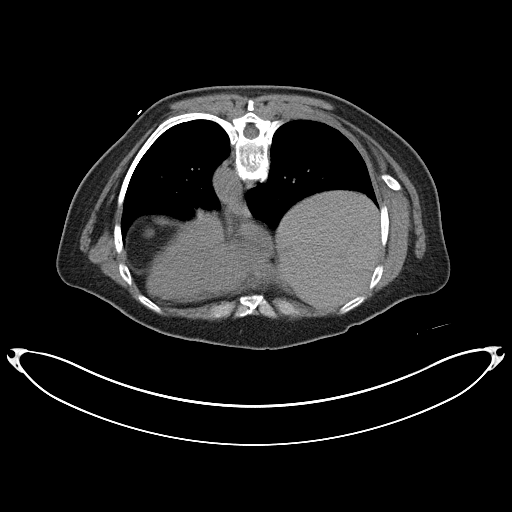
[im 59/63  lung]
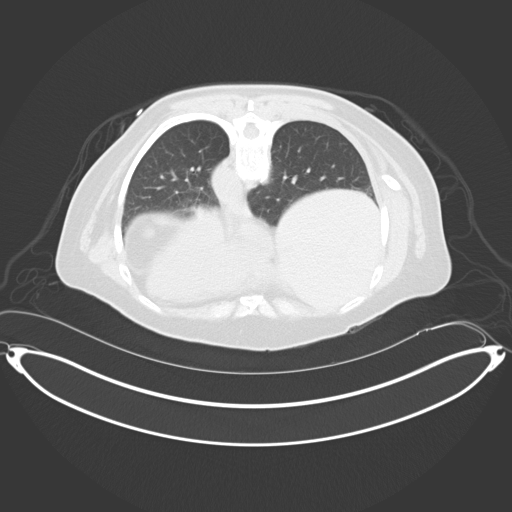

[14 of 32 positions shown; findings below may reference images not displayed]

EXAM:
CT-GUIDED CORE BIOPSY OF RIGHT RETROPERITONEAL LESION

MEDICATIONS:
None.

ANESTHESIA/SEDATION:
Moderate (conscious) sedation was employed during this procedure. A
total of Versed 3.0 mg and Fentanyl 150 mcg was administered
intravenously.

Moderate Sedation Time: 20 minutes. The patient's level of
consciousness and vital signs were monitored continuously by
radiology nursing throughout the procedure under my direct
supervision.

FLUOROSCOPY TIME:  None

COMPLICATIONS:
None immediate.

PROCEDURE:
Informed written consent was obtained from the patient after a
thorough discussion of the procedural risks, benefits and
alternatives. All questions were addressed. A timeout was performed
prior to the initiation of the procedure.

Patient was placed prone. Images through the abdomen were obtained.
The right side of the back was prepped with chlorhexidine and
sterile field was created. Skin was anesthetized with 1% lidocaine.
Using CT guidance, a 17 gauge coaxial needle was directed into the
right retroperitoneal lesion from a paraspinal approach. The needle
was positioned along the posterior aspect of the lesion. A total of
7 core biopsies were obtained with an 18 gauge device. Six specimens
were placed in formalin and one specimen was placed in normal
saline. Needle was removed without complication. Bandage placed over
the puncture site.
FINDINGS: 3.7 cm low-density lesion in the right retroperitoneum. No
significant bleeding or hematoma formation following the core
biopsies.
IMPRESSION: Successful CT-guided core biopsies of the right retroperitoneal
lesion.

## 2019-12-08 ENCOUNTER — Telehealth: Payer: Self-pay

## 2019-12-08 NOTE — Telephone Encounter (Signed)
NOTES ON [place] [phone], SENT REFERRAL TO [place]

## 2019-12-11 ENCOUNTER — Telehealth: Payer: Self-pay | Admitting: Interventional Cardiology

## 2019-12-11 NOTE — Telephone Encounter (Signed)
No message needed °

## 2020-02-06 NOTE — Progress Notes (Signed)
Cardiology Office Note:    Date:  02/07/2020   ID:  Maria Middleton, DOB 07-Sep-1950, MRN [id number]  PCP:  Christ Kick, MD  Cardiologist:  No primary care provider on file.   Referring MD: Christ Kick, *   Chief Complaint  Patient presents with  . Shortness of Breath  . Advice Only    Bundle branch block    History of Present Illness:    Maria Middleton is a 69 y.o. female with a hx of palpitations, and chest pain, referred from [facility]. Additional problems include essential hypertension, hyperlipidemia, LBBB, left renal cell carcinoma, and OSA..  Intermittent dyspnea on exertion.  There is variable threshold for dyspnea.  2-3 times per week she and her husband will walk 2-1/2 miles.  Some days she does well other days it is more difficult.  She sleeps elevated on pillows and not flat.  When she is flat there is shortness of breath.  She has had palpitations as recently as the late spring and early summer.  After getting off beta-blocker therapy.  Palpitations have improved.  She has never had syncope or prolonged tachycardia.  There is a positive family history of heart disease.  She has never smoked.  No history of diabetes.  Past Medical History:  Diagnosis Date  . Abdominal bloating   . Allergic rhinitis   . Allergic to food   . Anemia, unspecified   . 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 BMI 30.0-30.9,adult   . Chest pain   . Constipation   . 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 Depression   . Diarrhea   . Dyslipidemia   . Dysrhythmia    Hx LBBB  . [name] virus infection   . Fibromyalgia   . Headache    migraines  . Hormonal disorder   . Hypertension   . Hypertensive heart disease without congestive heart failure   . Hypothyroidism, unspecified   . Insomnia   . Left shoulder pain   . Lower abdominal pain   . Malaise and fatigue   . Nausea & vomiting   . Neck pain   .  Night terrors   . Nightmare disorder   . Osteoporosis   . Otogenic pain   . Pain in shoulder region, right   . Palpitations   . Panic attack   . Pneumonia    4 years ago  . Renal cell carcinoma of left kidney (HCC)   . Renal mass 08/25/2017  . Sleep apnea   . SOB (shortness of breath)   . Vaginal discharge   . Vitamin D deficiency     Past Surgical History:  Procedure Laterality Date  . ABDOMINAL HYSTERECTOMY     one ovary left  . ANKLE SURGERY     right ankle surgery  . DILATION AND CURETTAGE OF UTERUS    . ROBOTIC ASSITED PARTIAL NEPHRECTOMY Left 08/25/2017   Procedure: XI ROBOTIC ASSITED LEFT PARTIAL NEPHRECTOMY;  Surgeon: Ardis Hughs, MD;  Location: [facility];  Service: Urology;  Laterality: Left;  . 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 TUBAL LIGATION      Current Medications: Current Meds  Medication Sig  . Ascorbic Acid (VITAMIN C) POWD Take 1 Dose by mouth daily as needed (stress).  [name] aspirin-acetaminophen-caffeine (EXCEDRIN MIGRAINE) 250-250-65 MG tablet Take 1 tablet by mouth 2 (two) times daily as needed for headache.  [name]  hydrochlorothiazide (HYDRODIURIL) 25 MG tablet Take 25 mg by mouth daily.  [name] LORazepam (ATIVAN) 0.5 MG tablet Take 0.5 mg at bedtime as needed by mouth for sleep.  [name] losartan (COZAAR) 50 MG tablet Take 50 mg by mouth 2 (two) times daily.  [name] MAGNESIUM PO Take 1 tablet by mouth daily. With vitamin d  . OVER THE COUNTER MEDICATION Take 1 capsule by mouth at bedtime as needed (constipation). Cleanse More herbal laxative     Allergies:   Prolia [denosumab], Cheese, Milk-related compounds, Penicillins, and Sulfa antibiotics   Social History   Socioeconomic History  . Marital status: Single    Spouse name: Not on file  . Number of children: Not on file  . Years of education: Not on file  . Highest education level: Not on file  Occupational History  . Not on file  Tobacco Use  . Smoking status: Never Smoker  . Smokeless tobacco:  Never Used  Vaping Use  . Vaping Use: Never used  Substance and Sexual Activity  . Alcohol use: Yes    Comment: socially  . Drug use: No  . Sexual activity: Not on file  Other Topics Concern  . Not on file  Social History Narrative  . Not on file   Social Determinants of Health   Financial Resource Strain:   . Difficulty of Paying Living Expenses:   Food Insecurity:   . Worried About Charity fundraiser in the Last Year:   . Arboriculturist in the Last Year:   Transportation Needs:   . Film/video editor (Medical):   [name] Lack of Transportation (Non-Medical):   Physical Activity:   . Days of Exercise per Week:   . Minutes of Exercise per Session:   Stress:   . Feeling of Stress :   Social Connections:   . Frequency of Communication with Friends and Family:   . Frequency of Social Gatherings with Friends and Family:   . Attends Religious Services:   . Active Member of Clubs or Organizations:   . Attends Archivist Meetings:   [name] Marital Status:      Family History: The patient's family history is not on file.  ROS:   Please see the history of present illness.    She is aware of the left bundle.  Cannot remember when it was first diagnosed.  She has pain in her legs.  The pain is not precipitated by physical activity.  She is concerned about her family history of heart disease which included her mother having several myocardial infarctions and an aneurysm before the age 78, father having multiple strokes and MI.  Her brother has a pacemaker and is on anticoagulation.  History of irritable bowel syndrome.  History of renal cell carcinoma treated with partial resection.  All other systems reviewed and are negative.  EKGs/Labs/Other Studies Reviewed:    The following studies were reviewed today: Says I saw her many years ago.  No current cardiology data is available.  EKG:  EKG left bundle branch block with left axis deviation.  Left atrial abnormality.  When  compared with a tracing from June at Dr. Elvin So office, no changes noted.  Recent Labs: No results found for requested labs within last 8760 hours.  Recent Lipid Panel No results found for: CHOL, TRIG, HDL, CHOLHDL, VLDL, LDLCALC, LDLDIRECT  Physical Exam:    VS:  BP (!) 142/96   Pulse 75   Ht 5' 3.5" (1.613 m)  Wt 172 lb (78 kg)   BMI 29.99 kg/m     Wt Readings from Last 3 Encounters:  02/07/20 172 lb (78 kg)  08/25/17 164 lb 7.4 oz (74.6 kg)  08/16/17 162 lb 9.6 oz (73.8 kg)     GEN: Healthy-appearing mildly overweight. No acute distress HEENT: Normal NECK: No JVD. LYMPHATICS: No lymphadenopathy CARDIAC: S4 gallop RRR without murmur, no S3 gallop, or edema. VASCULAR:  Normal Pulses. No bruits. RESPIRATORY:  Clear to auscultation without rales, wheezing or rhonchi  ABDOMEN: Soft, non-tender, non-distended, No pulsatile mass, MUSCULOSKELETAL: No deformity  SKIN: Warm and dry NEUROLOGIC:  Alert and oriented x 3 PSYCHIATRIC:  Normal affect   ASSESSMENT:    1. Dyspnea on exertion   2. Palpitations   3. Left bundle branch block   4. Family history of chronic ischemic heart disease   5. Diabetes mellitus screening   6. Chest pain of uncertain etiology   7. Educated about COVID-19 virus infection   8. SOB (shortness of breath)   9. Hyperlipidemia LDL goal <70    PLAN:    In order of problems listed above:  1. Rule out left ventricular systolic or diastolic dysfunction.  The presence of left bundle branch block I am concerned about the possibility of LV systolic dysfunction.  2D Doppler echocardiogram will be ordered. 2. Palpitations resolved after primary care discontinued metoprolol. 3. She knew about the left bundle branch block.  An echo will be done to assess LV function. 4. We she will undergo a coronary calcium score to get some sense of whether or not there is significant undiagnosed atherosclerotic burden.  She is not currently treating lipids and her blood  pressure is poorly controlled. 5. We will get a hemoglobin A1c. 6. No current chest pain 7. Covid vaccinated. 8. Needs fasting lipid panel.   Medication Adjustments/Labs and Tests Ordered: Current medicines are reviewed at length with the patient today.  Concerns regarding medicines are outlined above.  Orders Placed This Encounter  Procedures  . CT CARDIAC SCORING  . Lipid panel  . HgB A1c  . EKG 12-Lead  . ECHOCARDIOGRAM COMPLETE   No orders of the defined types were placed in this encounter.   Patient Instructions  Medication Instructions:  Your physician recommends that you continue on your current medications as directed. Please refer to the Current Medication list given to you today.  *If you need a refill on your cardiac medications before your next appointment, please call your pharmacy*   Lab Work: Lipid and A1C when you come back for echo.  You will need to be fasting for these labs (nothing to eaat or drink after midnight except water and black coffee).  If you have labs (blood work) drawn today and your tests are completely normal, you will receive your results only by: [name] MyChart Message (if you have MyChart) OR . A paper copy in the mail If you have any lab test that is abnormal or we need to change your treatment, we will call you to review the results.   Testing/Procedures: Your physician has requested that you have an echocardiogram. Echocardiography is a painless test that uses sound waves to create images of your heart. It provides your doctor with information about the size and shape of your heart and how well your heart's chambers and valves are working. This procedure takes approximately one hour. There are no restrictions for this procedure.  You physician recommends that you have a Calcium Score performed.  Follow-Up: At [facility], you and your health needs are our priority.  As part of our continuing mission to provide you with exceptional  heart care, we have created designated Provider Care Teams.  These Care Teams include your primary Cardiologist (physician) and Advanced Practice Providers (APPs -  Physician Assistants and Nurse Practitioners) who all work together to provide you with the care you need, when you need it.  We recommend signing up for the patient portal called "MyChart".  Sign up information is provided on this After Visit Summary.  MyChart is used to connect with patients for Virtual Visits (Telemedicine).  Patients are able to view lab/test results, encounter notes, upcoming appointments, etc.  Non-urgent messages can be sent to your provider as well.   To learn more about what you can do with MyChart, go to [email or web address].    Your next appointment:   6 week(s)- can have 10:20A or 1:40p on 03/22/20  The format for your next appointment:   In Person  Provider:   You may see Dr. Daneen Schick or one of the following Advanced Practice Providers on your designated Care Team:    Truitt Merle, NP  Cecilie Kicks, NP  Kathyrn Drown, NP    Other Instructions      Signed, Sinclair Grooms, MD  02/07/2020 3:32 PM    [place]

## 2020-02-07 ENCOUNTER — Other Ambulatory Visit: Payer: Self-pay | Admitting: Legal Medicine

## 2020-02-07 ENCOUNTER — Other Ambulatory Visit: Payer: Self-pay

## 2020-02-07 ENCOUNTER — Ambulatory Visit (INDEPENDENT_AMBULATORY_CARE_PROVIDER_SITE_OTHER): Payer: Medicare Other | Admitting: Interventional Cardiology

## 2020-02-07 ENCOUNTER — Encounter: Payer: Self-pay | Admitting: Interventional Cardiology

## 2020-02-07 VITALS — BP 142/96 | HR 75 | Ht 63.5 in | Wt 172.0 lb

## 2020-02-07 DIAGNOSIS — Z131 Encounter for screening for diabetes mellitus: Secondary | ICD-10-CM

## 2020-02-07 DIAGNOSIS — R002 Palpitations: Secondary | ICD-10-CM

## 2020-02-07 DIAGNOSIS — R079 Chest pain, unspecified: Secondary | ICD-10-CM

## 2020-02-07 DIAGNOSIS — R0609 Other forms of dyspnea: Secondary | ICD-10-CM

## 2020-02-07 DIAGNOSIS — R0602 Shortness of breath: Secondary | ICD-10-CM

## 2020-02-07 DIAGNOSIS — Z8249 Family history of ischemic heart disease and other diseases of the circulatory system: Secondary | ICD-10-CM

## 2020-02-07 DIAGNOSIS — R0600 Dyspnea, unspecified: Secondary | ICD-10-CM | POA: Diagnosis not present

## 2020-02-07 DIAGNOSIS — E785 Hyperlipidemia, unspecified: Secondary | ICD-10-CM

## 2020-02-07 DIAGNOSIS — I447 Left bundle-branch block, unspecified: Secondary | ICD-10-CM

## 2020-02-07 DIAGNOSIS — Z7189 Other specified counseling: Secondary | ICD-10-CM

## 2020-02-07 NOTE — Patient Instructions (Addendum)
Medication Instructions:  Your physician recommends that you continue on your current medications as directed. Please refer to the Current Medication list given to you today.  *If you need a refill on your cardiac medications before your next appointment, please call your pharmacy*   Lab Work: Lipid and A1C when you come back for echo.  You will need to be fasting for these labs (nothing to eaat or drink after midnight except water and black coffee).  If you have labs (blood work) drawn today and your tests are completely normal, you will receive your results only by: [name] MyChart Message (if you have MyChart) OR . A paper copy in the mail If you have any lab test that is abnormal or we need to change your treatment, we will call you to review the results.   Testing/Procedures: Your physician has requested that you have an echocardiogram. Echocardiography is a painless test that uses sound waves to create images of your heart. It provides your doctor with information about the size and shape of your heart and how well your heart's chambers and valves are working. This procedure takes approximately one hour. There are no restrictions for this procedure.  You physician recommends that you have a Calcium Score performed.    Follow-Up: At [facility], you and your health needs are our priority.  As part of our continuing mission to provide you with exceptional heart care, we have created designated Provider Care Teams.  These Care Teams include your primary Cardiologist (physician) and Advanced Practice Providers (APPs -  Physician Assistants and Nurse Practitioners) who all work together to provide you with the care you need, when you need it.  We recommend signing up for the patient portal called "MyChart".  Sign up information is provided on this After Visit Summary.  MyChart is used to connect with patients for Virtual Visits (Telemedicine).  Patients are able to view lab/test results,  encounter notes, upcoming appointments, etc.  Non-urgent messages can be sent to your provider as well.   To learn more about what you can do with MyChart, go to [email or web address].    Your next appointment:   6 week(s)- can have 10:20A or 1:40p on 03/22/20  The format for your next appointment:   In Person  Provider:   You may see Dr. Daneen Schick or one of the following Advanced Practice Providers on your designated Care Team:    Truitt Merle, NP  Cecilie Kicks, NP  Kathyrn Drown, NP    Other Instructions

## 2020-02-27 ENCOUNTER — Other Ambulatory Visit: Payer: Self-pay

## 2020-02-27 ENCOUNTER — Ambulatory Visit (HOSPITAL_COMMUNITY): Payer: Medicare Other | Attending: Internal Medicine

## 2020-02-27 ENCOUNTER — Ambulatory Visit (INDEPENDENT_AMBULATORY_CARE_PROVIDER_SITE_OTHER)
Admission: RE | Admit: 2020-02-27 | Discharge: 2020-02-27 | Disposition: A | Discharge status: Home / Self Care | Payer: Self-pay | Source: Ambulatory Visit | Attending: Interventional Cardiology | Admitting: Interventional Cardiology

## 2020-02-27 ENCOUNTER — Other Ambulatory Visit: Payer: Medicare Other | Admitting: *Deleted

## 2020-02-27 DIAGNOSIS — Z131 Encounter for screening for diabetes mellitus: Secondary | ICD-10-CM

## 2020-02-27 DIAGNOSIS — R0602 Shortness of breath: Secondary | ICD-10-CM

## 2020-02-27 DIAGNOSIS — R079 Chest pain, unspecified: Secondary | ICD-10-CM

## 2020-02-27 DIAGNOSIS — R002 Palpitations: Secondary | ICD-10-CM

## 2020-02-27 LAB — ECHOCARDIOGRAM COMPLETE
Area-P 1/2: 5.84 cm2
S' Lateral: 2.5 cm

## 2020-02-28 LAB — LIPID PANEL
Chol/HDL Ratio: 2.8 ratio (ref 0.0–4.4)
Cholesterol, Total: 191 mg/dL (ref 100–199)
HDL: 68 mg/dL
LDL Chol Calc (NIH): 111 mg/dL — ABNORMAL HIGH (ref 0–99)
Triglycerides: 62 mg/dL (ref 0–149)
VLDL Cholesterol Cal: 12 mg/dL (ref 5–40)

## 2020-02-28 LAB — HEMOGLOBIN A1C
Est. average glucose Bld gHb Est-mCnc: 126 mg/dL
Hgb A1c MFr Bld: 6 % — ABNORMAL HIGH (ref 4.8–5.6)

## 2020-04-05 DIAGNOSIS — G4730 Sleep apnea, unspecified: Secondary | ICD-10-CM | POA: Insufficient documentation

## 2020-04-23 DIAGNOSIS — Z8639 Personal history of other endocrine, nutritional and metabolic disease: Secondary | ICD-10-CM | POA: Insufficient documentation

## 2020-04-23 DIAGNOSIS — Z87898 Personal history of other specified conditions: Secondary | ICD-10-CM | POA: Insufficient documentation

## 2020-04-23 DIAGNOSIS — E782 Mixed hyperlipidemia: Secondary | ICD-10-CM | POA: Insufficient documentation

## 2020-04-23 DIAGNOSIS — E538 Deficiency of other specified B group vitamins: Secondary | ICD-10-CM | POA: Insufficient documentation

## 2020-04-23 DIAGNOSIS — K5909 Other constipation: Secondary | ICD-10-CM | POA: Insufficient documentation

## 2020-04-25 ENCOUNTER — Telehealth: Payer: Self-pay | Admitting: Interventional Cardiology

## 2020-04-25 NOTE — Progress Notes (Addendum)
Telehealth Visit     Virtual Visit via Telephone Note   This visit type was conducted due to national recommendations for restrictions regarding the COVID-19 Pandemic (e.g. social distancing) in an effort to limit this patient's exposure and mitigate transmission in our community.  Due to her co-morbid illnesses, this patient is at least at moderate risk for complications without adequate follow up.  This format is felt to be most appropriate for this patient at this time.  The patient did not have access to video technology/had technical difficulties with video requiring transitioning to audio format only (telephone).  All issues noted in this document were discussed and addressed.  No physical exam could be performed with this format.  Please refer to the patient's chart for her  consent to telehealth for [facility].   Evaluation Performed:  Follow-up visit   The patient was identified using 2 identifiers.   This visit type was conducted due to national recommendations for restrictions regarding the COVID-19 Pandemic (e.g. social distancing).  This format is felt to be most appropriate for this patient at this time.  All issues noted in this document were discussed and addressed.  No physical exam was performed (except for noted visual exam findings with Video Visits).  Please refer to the patient's chart (MyChart message for video visits and phone note for telephone visits) for the patient's consent to telehealth for [facility].  Date:  05/01/2020   ID:  Maria Middleton, DOB 1951-06-12, MRN [id number]  Patient Location:  Home  Provider location:   Home  PCP:  Gweneth Fritter, FNP  Cardiologist:  Servando Snare & No primary care provider on file.  Electrophysiologist:  None   Chief Complaint:  Follow up  History of Present Illness:    Maria Middleton is a 69 y.o. female who presents via audio/video conferencing for a telehealth visit today.  Seen for Dr. Tamala Julian.   She has a  history of palpitations and chest pain, HTN, HLD, LBBB, left renal cell carcinoma and OSA. There is a positive family history of heart disease.  She has never smoked.  No history of diabetes.  Seen this past August by Dr. Tamala Julian for intermittent DOE and palpitations. Echo with EF at 50% and grade I DD. Cardiac scoring zero.   The patient does not have symptoms concerning for COVID-19 infection (fever, chills, cough, or new shortness of breath).   Seen today by telephone call. She has consented for this visit. She notes she has had chest pain - not today - but had last night - no real "rhyme or reason" according to her. She notes the chest pain started after she saw Dr. Tamala Julian - did not have this back in August. Described as a "squeezing" sensation. Worse when really tired. Can happen after an active day. It can last varying times - sometimes for 45 minutes or even up to 2 hours - she will stop and rest and lie down to rest until it subsides. She was in the ER down at [place] last week and apparently they wanted her to have testing and stay overnight - she did not wish to do that. She is asking about troponin level - sounds like this was negative. Opted to wait til this visit. I am not able to see those records. She notes her BP is trending up - she feels her HCTZ is "depleting all her fluid".    Past Medical History:  Diagnosis Date  . Abdominal bloating   .  Allergic rhinitis   . Allergic to food   . Anemia, unspecified   . 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 BMI 30.0-30.9,adult   . Chest pain   . Constipation   . Cytomegaloviral disease ([place])   . Depression   . Diarrhea   . Dyslipidemia   . Dysrhythmia    Hx LBBB  . [name] virus infection   . Fibromyalgia   . Headache    migraines  . Hormonal disorder   . Hypertension   . Hypertensive heart disease without congestive heart failure   . Hypothyroidism, unspecified   . Insomnia   . Left  shoulder pain   . Lower abdominal pain   . Malaise and fatigue   . Nausea & vomiting   . Neck pain   . Night terrors   . Nightmare disorder   . Osteoporosis   . Otogenic pain   . Pain in shoulder region, right   . Palpitations   . Panic attack   . Pneumonia    4 years ago  . Renal cell carcinoma of left kidney (HCC)   . Renal mass 08/25/2017  . Sleep apnea   . SOB (shortness of breath)   . Vaginal discharge   . Vitamin D deficiency    Past Surgical History:  Procedure Laterality Date  . ABDOMINAL HYSTERECTOMY     one ovary left  . ANKLE SURGERY     right ankle surgery  . DILATION AND CURETTAGE OF UTERUS    . ROBOTIC ASSITED PARTIAL NEPHRECTOMY Left 08/25/2017   Procedure: XI ROBOTIC ASSITED LEFT PARTIAL NEPHRECTOMY;  Surgeon: Ardis Hughs, MD;  Location: [facility];  Service: Urology;  Laterality: Left;  . 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 TUBAL LIGATION       Current Meds  Medication Sig  . Ascorbic Acid (VITAMIN C) POWD Take 1 Dose by mouth daily as needed (stress).  [name] aspirin-acetaminophen-caffeine (EXCEDRIN MIGRAINE) 250-250-65 MG tablet Take 1 tablet by mouth 2 (two) times daily as needed for headache.  . Digestive Enzyme CAPS Take 1 capsule by mouth 3 (three) times daily as needed (bloating).   [name] ECHINACEA PO Take 1 capsule by mouth daily as needed (immune support).   [name] escitalopram (LEXAPRO) 10 MG tablet Take by mouth.  . hydrochlorothiazide (HYDRODIURIL) 25 MG tablet Take 25 mg by mouth daily.  [name] LORazepam (ATIVAN) 0.5 MG tablet Take 0.5 mg at bedtime as needed by mouth for sleep.  [name] losartan (COZAAR) 50 MG tablet Take 50 mg by mouth 2 (two) times daily.  [name] MAGNESIUM PO Take 1 tablet by mouth daily. With vitamin d  . meloxicam (MOBIC) 15 MG tablet Take 15 mg by mouth as needed for pain (muscle pain).   . NON FORMULARY Take 2 capsules by mouth as needed (for IBS ( triphala)).  [name] OVER THE COUNTER MEDICATION Take 1 capsule by mouth at bedtime as  needed (constipation). Cleanse More herbal laxative     Allergies:   Penicillin g, Prolia [denosumab], Cheese, Milk-related compounds, Penicillins, Sulfa antibiotics, and Sulfamethoxazole   Social History   Tobacco Use  . Smoking status: Never Smoker  . Smokeless tobacco: Never Used  Vaping Use  . Vaping Use: Never used  Substance Use Topics  . Alcohol use: Yes    Comment: socially  . Drug use: No     Family Hx: The patient's family history is  not on file.  ROS:   Please see the history of present illness.   All other systems reviewed are negative.    Objective:    Vital Signs:  BP 139/72   Pulse 71   Ht 5' 6.5" (1.689 m)   Wt 170 lb (77.1 kg)   BMI 27.03 kg/m    Wt Readings from Last 3 Encounters:  05/01/20 170 lb (77.1 kg)  02/07/20 172 lb (78 kg)  08/25/17 164 lb 7.4 oz (74.6 kg)    Alert female in no acute distress. Sounds appropriate. Not short of breath with conversation.    Labs/Other Tests and Data Reviewed:    Lab Results  Component Value Date   WBC 7.0 08/26/2017   HGB 11.7 (L) 08/26/2017   HCT 34.5 (L) 08/26/2017   PLT 166 08/26/2017   GLUCOSE 91 08/26/2017   CHOL 191 02/27/2020   TRIG 62 02/27/2020   HDL 68 02/27/2020   LDLCALC 111 (H) 02/27/2020   ALT 11 (L) 08/16/2017   AST 21 08/16/2017   NA 136 08/26/2017   K 3.7 08/26/2017   CL 104 08/26/2017   CREATININE 1.09 (H) 08/26/2017   BUN 11 08/26/2017   CO2 26 08/26/2017   INR 1.02 05/11/2017   HGBA1C 6.0 (H) 02/27/2020       BNP (last 3 results) No results for input(s): BNP in the last 8760 hours.  ProBNP (last 3 results) No results for input(s): PROBNP in the last 8760 hours.    Prior CV studies:    The following studies were reviewed today:  ECHO IMPRESSIONS 01/2020  1. Left ventricular ejection fraction, by estimation, is 50%. The left  ventricle has low normal function. The left ventricle has no regional wall  motion abnormalities. There is moderate asymmetric left  ventricular  hypertrophy of the basal-septal  segment. Left ventricular diastolic parameters are consistent with Grade I  diastolic dysfunction (impaired relaxation).  2. Right ventricular systolic function is normal. The right ventricular  size is normal. There is normal pulmonary artery systolic pressure. The  estimated right ventricular systolic pressure is 94.8 mmHg.  3. The mitral valve is normal in structure. Trivial mitral valve  regurgitation. No evidence of mitral stenosis.  4. The aortic valve is tricuspid. Aortic valve regurgitation is not  visualized. No aortic stenosis is present.  5. The inferior vena cava is normal in size with greater than 50%  respiratory variability, suggesting right atrial pressure of 3 mmHg.    CALCIUM SCORING 02/2020 IMPRESSION: Coronary calcium score of 0 Agatston units. This suggests low risk for future cardiac events.  Dalton Mclean   Electronically Signed   By: Loralie Champagne M.D.   On: 02/28/2020 16:47  ASSESSMENT & PLAN:    1.  Chest pain - she has known LBBB along with risk factors of DM and + FH - she has had recent hospitalization - unclear if she had stress testing - does not sound like she did - will arrange to get records from [facility] for review; arrange Lexiscan Myoview and check BMET on day of stress testing. Further disposition to follow.   2. HTN - little above goal - she feels she is not tolerating HCTZ - will check BMET on day of testing.   3. Chronic fatigue  4. Palpitations/DOE - does not really endorse this today.   5. Chronic LBBB  6. HLD - Prior calcium scoring of zero.    Patient Risk:   After full review of  this patient's clinical status, I feel that they are at least moderate risk at this time.  Time:   Today, I have spent 8 minutes with the patient with telehealth technology discussing the above issues.     Medication Adjustments/Labs and Tests Ordered: Current medicines are reviewed at length  with the patient today.  Concerns regarding medicines are outlined above.   Tests Ordered: Orders Placed This Encounter  Procedures  . Basic metabolic panel  . MYOCARDIAL PERFUSION IMAGING    Medication Changes: No orders of the defined types were placed in this encounter.   Disposition:  FU with Dr. Tamala Julian after testing is complete.    Patient is agreeable to this plan and will call if any problems develop in the interim.   Amie Critchley, NP  05/01/2020 3:31 PM    [facility]   Addendum:  Records received are from 2016 - had negative stress testing in 2016 noted.   Burtis Junes, RN, [place] [address] [place], [place]  [address] [phone]   Addendum: I have spoken to Ms. Cataldi - the only stress test I see is from 2016 done at [facility] with Dr. Flonnie Overman. She notes that this would be her last stress test that has been performed. We will proceed as planned with Myoview next week.   Burtis Junes, RN, [place] [address] [place], [place]  [address] [phone]

## 2020-05-01 ENCOUNTER — Other Ambulatory Visit: Payer: Self-pay

## 2020-05-01 ENCOUNTER — Encounter: Payer: Self-pay | Admitting: Nurse Practitioner

## 2020-05-01 ENCOUNTER — Telehealth (INDEPENDENT_AMBULATORY_CARE_PROVIDER_SITE_OTHER): Payer: Medicare Other | Admitting: Nurse Practitioner

## 2020-05-01 ENCOUNTER — Telehealth: Payer: Self-pay | Admitting: *Deleted

## 2020-05-01 VITALS — BP 139/72 | HR 71 | Ht 66.5 in | Wt 170.0 lb

## 2020-05-01 DIAGNOSIS — R0600 Dyspnea, unspecified: Secondary | ICD-10-CM | POA: Diagnosis not present

## 2020-05-01 DIAGNOSIS — I447 Left bundle-branch block, unspecified: Secondary | ICD-10-CM | POA: Diagnosis not present

## 2020-05-01 DIAGNOSIS — I259 Chronic ischemic heart disease, unspecified: Secondary | ICD-10-CM

## 2020-05-01 DIAGNOSIS — R002 Palpitations: Secondary | ICD-10-CM

## 2020-05-01 DIAGNOSIS — E785 Hyperlipidemia, unspecified: Secondary | ICD-10-CM

## 2020-05-01 DIAGNOSIS — R0609 Other forms of dyspnea: Secondary | ICD-10-CM

## 2020-05-01 NOTE — Patient Instructions (Addendum)
After Visit Summary:  We will be checking the following labs today - NONE  We will check your potassium level the day you come for your stress test   Medication Instructions:    Continue with your current medicines for now.    If you need a refill on your cardiac medications before your next appointment, please call your pharmacy.     Testing/Procedures To Be Arranged:  [place] are scheduled for a Myocardial Perfusion Imaging Study on __________________________ at _________________________________________.   Please arrive 15 minutes prior to your appointment time for registration and insurance purposes.   The test will take approximately 3 to 4 hours to complete; you may bring reading material. If someone comes with you to your appointment, they will need to remain in the main lobby due to limited space in the testing area.   How to prepare for your Myocardial Perfusion test:   Do not eat or drink 3 hours prior to your test, except you may have water.    Do not consume products containing caffeine (regular or decaffeinated) 12 hours prior to your test (ex: coffee, chocolate, soda, tea)   Do bring a list of your current medications with you. If not listed below, you may take your medications as normal.    Bring any held medication to your appointment, as you may be required to take it once the test is complete.   Do wear comfortable clothes (no dresses or overalls) and walking shoes. Tennis shoes are preferred. No heels or open toed shoes.  Do not wear cologne, perfume, aftershave or lotions (deodorant is allowed).   If these instructions are not followed, you test will have to be rescheduled.   Please report to [address] for your test. If you have questions or concerns about your appointment, please call the Nuclear Lab at [phone].  If you cannot keep your appointment, please provide 24 hour notification to the Nuclear lab to  avoid a possible $50 charge to your account.    Follow-Up:  See Dr. Tamala Julian afterwards for discussion and BP review.    At [facility], you and your health needs are our priority.  As part of our continuing mission to provide you with exceptional heart care, we have created designated Provider Care Teams.  These Care Teams include your primary Cardiologist (physician) and Advanced Practice Providers (APPs -  Physician Assistants and Nurse Practitioners) who all work together to provide you with the care you need, when you need it.  Special Instructions:  . Stay safe, wash your hands for at least 20 seconds and wear a mask when needed.  . It was good to talk with you today.  [name] Keep a check on your BP for [place] and keep a record - bring to your next visit for review.    Call the [place] office at [phone] if you have any questions, problems or concerns.

## 2020-05-01 NOTE — Telephone Encounter (Signed)
  Patient Consent for Virtual Visit         [name] has provided verbal consent on 05/01/2020 for a virtual visit (video or telephone).   CONSENT FOR VIRTUAL VISIT FOR:  Maria Middleton  By participating in this virtual visit I agree to the following:  I hereby voluntarily request, consent and authorize [facility] and its employed or contracted physicians, physician assistants, nurse practitioners or other licensed health care professionals (the Practitioner), to provide me with telemedicine health care services (the "Services") as deemed necessary by the treating Practitioner. I acknowledge and consent to receive the Services by the Practitioner via telemedicine. I understand that the telemedicine visit will involve communicating with the Practitioner through live audiovisual communication technology and the disclosure of certain medical information by electronic transmission. I acknowledge that I have been given the opportunity to request an in-person assessment or other available alternative prior to the telemedicine visit and am voluntarily participating in the telemedicine visit.  I understand that I have the right to withhold or withdraw my consent to the use of telemedicine in the course of my care at any time, without affecting my right to future care or treatment, and that the Practitioner or I may terminate the telemedicine visit at any time. I understand that I have the right to inspect all information obtained and/or recorded in the course of the telemedicine visit and may receive copies of available information for a reasonable fee.  I understand that some of the potential risks of receiving the Services via telemedicine include:  [name] Delay or interruption in medical evaluation due to technological equipment failure or disruption; . Information transmitted may not be sufficient (e.g. poor resolution of images) to allow for appropriate medical decision making by the  Practitioner; and/or  . In rare instances, security protocols could fail, causing a breach of personal health information.  Furthermore, I acknowledge that it is my responsibility to provide information about my medical history, conditions and care that is complete and accurate to the best of my ability. I acknowledge that Practitioner's advice, recommendations, and/or decision may be based on factors not within their control, such as incomplete or inaccurate data provided by me or distortions of diagnostic images or specimens that may result from electronic transmissions. I understand that the practice of medicine is not an exact science and that Practitioner makes no warranties or guarantees regarding treatment outcomes. I acknowledge that a copy of this consent can be made available to me via my patient portal ([facility]), or I can request a printed copy by calling the office of [place].    I understand that my insurance will be billed for this visit.   I have read or had this consent read to me. . I understand the contents of this consent, which adequately explains the benefits and risks of the Services being provided via telemedicine.  . I have been provided ample opportunity to ask questions regarding this consent and the Services and have had my questions answered to my satisfaction. . I give my informed consent for the services to be provided through the use of telemedicine in my medical care

## 2020-05-08 ENCOUNTER — Telehealth (HOSPITAL_COMMUNITY): Payer: Self-pay

## 2020-05-08 NOTE — Telephone Encounter (Signed)
Detailed instructions left on the patient's answering machine. Asked to call back with any questions. S.Ahnaf Caponi EMTP 

## 2020-05-14 ENCOUNTER — Other Ambulatory Visit: Payer: Self-pay

## 2020-05-14 ENCOUNTER — Ambulatory Visit (HOSPITAL_COMMUNITY): Payer: Medicare Other | Attending: Cardiology

## 2020-05-14 ENCOUNTER — Other Ambulatory Visit: Payer: Medicare Other

## 2020-05-14 VITALS — Ht 66.0 in | Wt 170.0 lb

## 2020-05-14 DIAGNOSIS — I259 Chronic ischemic heart disease, unspecified: Secondary | ICD-10-CM | POA: Diagnosis not present

## 2020-05-14 DIAGNOSIS — R110 Nausea: Secondary | ICD-10-CM | POA: Diagnosis present

## 2020-05-14 LAB — BASIC METABOLIC PANEL
BUN/Creatinine Ratio: 11 — ABNORMAL LOW (ref 12–28)
BUN: 12 mg/dL (ref 8–27)
CO2: 24 mmol/L (ref 20–29)
Calcium: 9.3 mg/dL (ref 8.7–10.3)
Chloride: 104 mmol/L (ref 96–106)
Creatinine, Ser: 1.11 mg/dL — ABNORMAL HIGH (ref 0.57–1.00)
GFR calc Af Amer: 58 mL/min/{1.73_m2} — ABNORMAL LOW (ref >59)
GFR calc non Af Amer: 50 mL/min/{1.73_m2} — ABNORMAL LOW (ref >59)
Glucose: 85 mg/dL (ref 65–99)
Potassium: 4 mmol/L (ref 3.5–5.2)
Sodium: 139 mmol/L (ref 134–144)

## 2020-05-14 LAB — MYOCARDIAL PERFUSION IMAGING
LV dias vol: 73 mL (ref 46–106)
LV sys vol: 35 mL
Peak HR: 121 {beats}/min
Rest HR: 57 {beats}/min
SDS: 3
SRS: 0
SSS: 3
TID: 1.06

## 2020-05-14 MED ORDER — AMINOPHYLLINE 25 MG/ML IV SOLN
150.0000 mg | Freq: Once | INTRAVENOUS | Status: AC
  Administered 2020-05-14: 150 mg via INTRAVENOUS

## 2020-05-14 MED ORDER — TECHNETIUM TC 99M TETROFOSMIN IV KIT
30.4000 | PACK | Freq: Once | INTRAVENOUS | Status: AC | PRN
  Administered 2020-05-14: 30.4 via INTRAVENOUS
  Filled 2020-05-14: qty 31

## 2020-05-14 MED ORDER — REGADENOSON 0.4 MG/5ML IV SOLN
0.4000 mg | Freq: Once | INTRAVENOUS | Status: AC
  Administered 2020-05-14: 0.4 mg via INTRAVENOUS

## 2020-05-14 MED ORDER — TECHNETIUM TC 99M TETROFOSMIN IV KIT
10.2000 | PACK | Freq: Once | INTRAVENOUS | Status: AC | PRN
  Administered 2020-05-14: 10.2 via INTRAVENOUS
  Filled 2020-05-14: qty 11

## 2020-06-04 ENCOUNTER — Ambulatory Visit: Payer: Medicare Other | Admitting: Interventional Cardiology

## 2020-06-06 ENCOUNTER — Telehealth: Payer: Self-pay | Admitting: Interventional Cardiology

## 2020-06-06 NOTE — Telephone Encounter (Signed)
Call office they have received the clearance fax.

## 2020-06-06 NOTE — Telephone Encounter (Signed)
Follow Up:     Pt needs her clearance fax asap, her appt is today at 2:00 . Please fax to [phone].

## 2020-06-06 NOTE — Telephone Encounter (Signed)
   Primary Cardiologist: Sinclair Grooms, MD  Chart reviewed as part of pre-operative protocol coverage.   Simple dental extractions are considered low risk procedures per guidelines and generally do not require any specific cardiac clearance. It is also generally accepted that for simple extractions and dental cleanings, there is no need to interrupt blood thinner therapy.  SBE prophylaxis is not required for the patient from a cardiac standpoint.  I will route this recommendation to the requesting party via Epic fax function and remove from pre-op pool.  Please call with questions.  Darreld Mclean, PA-C 06/06/2020, 11:26 AM

## 2020-06-06 NOTE — Telephone Encounter (Signed)
I do not see any clearance form or mention of upcoming procedure in patient's chart. Tried to call patient for more details but did not get an answer. Left message for patient to have clearance form faxed to our office and provided fax number.  Pre-op covering staff, can you follow-up on this and see if you can reach patient for more information?  Thank you!

## 2020-06-06 NOTE — Telephone Encounter (Signed)
   [facility] Pre-operative Risk Assessment    HEARTCARE STAFF: - Please ensure there is not already an duplicate clearance open for this procedure. - Under Visit Info/Reason for Call, type in Other and utilize the format Clearance MM/DD/YY or Clearance TBD. Do not use dashes or single digits. - If request is for dental extraction, please clarify the # of teeth to be extracted.  Request for surgical clearance:  1. What type of surgery is being performed? 1 tooth extraction and dentures  2. When is this surgery scheduled? 06/06/20   3. What type of clearance is required (medical clearance vs. Pharmacy clearance to hold med vs. Both)? medical  4. Are there any medications that need to be held prior to surgery and how long?  n/a  5. Practice name and name of physician performing surgery? Dr. Corine Shelter at [facility]  6. What is the office phone number? [phone]   7.   What is the office fax number? [phone]  8.   Anesthesia type (None, local, MAC, general) ? None    Leah Newnam 06/06/2020, 10:37 AM  _________________________________________________________________   (provider comments below)

## 2020-06-06 NOTE — Telephone Encounter (Signed)
Pre-op covering staff, my previous message can be ignored. A clearance form was scanned in and I have addressed it.  Thank you!

## 2020-07-03 DIAGNOSIS — M159 Polyosteoarthritis, unspecified: Secondary | ICD-10-CM | POA: Insufficient documentation

## 2020-08-02 DIAGNOSIS — Z8719 Personal history of other diseases of the digestive system: Secondary | ICD-10-CM | POA: Insufficient documentation

## 2020-09-24 DIAGNOSIS — C642 Malignant neoplasm of left kidney, except renal pelvis: Secondary | ICD-10-CM | POA: Insufficient documentation

## 2020-10-22 DIAGNOSIS — Z780 Asymptomatic menopausal state: Secondary | ICD-10-CM | POA: Insufficient documentation

## 2020-11-20 DIAGNOSIS — I447 Left bundle-branch block, unspecified: Secondary | ICD-10-CM

## 2020-11-20 DIAGNOSIS — R55 Syncope and collapse: Secondary | ICD-10-CM

## 2021-01-02 DIAGNOSIS — R140 Abdominal distension (gaseous): Secondary | ICD-10-CM | POA: Insufficient documentation

## 2021-01-02 DIAGNOSIS — R350 Frequency of micturition: Secondary | ICD-10-CM | POA: Insufficient documentation

## 2021-01-30 DIAGNOSIS — F5101 Primary insomnia: Secondary | ICD-10-CM | POA: Insufficient documentation

## 2022-05-12 ENCOUNTER — Telehealth: Payer: Self-pay | Admitting: Gastroenterology

## 2022-05-12 NOTE — Telephone Encounter (Signed)
Good Morning Dr.Gupta,  We received a referral on this patient to be seen for LLQ pain. She has GI hx at [facility]. She is requesting a transfer because she is not pleased with the care she has received with them. She has requested you as her provider.    We have records for review, please advise on scheduling. Thank you.

## 2022-05-15 NOTE — Telephone Encounter (Signed)
Patient scheduled for 12-21 with Estill Bamberg PA at 65am was told to arrive at 59  and was told to call back if she feels like she is going into a flare up or having increased pain

## 2022-06-17 NOTE — Progress Notes (Unsigned)
06/18/2022 Maria Middleton [id number] [birth year]  Referring provider: Gweneth Fritter, FNP Primary GI doctor: Dr. Lyndel Safe ( [facility])  ASSESSMENT AND PLAN:   Irritable bowel syndrome with constipation possible component of pelvis floor dysfunction with history and symptoms of urinary issues as well Will treat with miralax/fiber, squatty potty.   Refer to pelvic floor PT Can consider anal manometry.  Add fiber to miralax, can consider trulance, did not do well with linzes.  Colon unremarkable 2017, recall 2027  Postprandial epigastric pain with nausea for last year Lifestyle changes discussed, avoid NSAIDS, ETOH Will do trial of PPI once daily  Will schedule EGD to evaluate for possible H. pylori, esophagitis, gastritis, peptic ulcer disease, etc.. I discussed risks of EGD with patient today, including risk of sedation, bleeding or perforation.  Patient provides understanding and gave verbal consent to proceed. If negative, can consider gastroparesis study with symptoms and history.  Right RCC S/p partial nephrectomy Recent negative staging CT, will get records from [facility]  Patient Care Team: Gweneth Fritter, FNP as PCP - General (Family Medicine) Belva Crome, MD as PCP - Cardiology (Cardiology)  HISTORY OF PRESENT ILLNESS: 71 y.o. female with a past medical history of hypertension, depression, GERD, constipation, diverticulosis, status post hysterectomy, right renal cell carcinoma 2018 s/p left partial nephrectomy with Dr. Louis Meckel and others listed below presents to establish care, [facility].  08/20/2015 colonoscopy no polyps removed recall 10 years. She has had constipation forever and IBS-C, can not tolerate linzess.  She would take magnesium and trifelo an herbel blend at night but this did not work well so she will take stimulant.   [facility], told to take fiber and magnesium.  She  continues to follow with Dr. Louis Meckel, she had recent CT with and without in Sept there.   She states she has had LLQ pain and  epigastric pain worse with any food, she has some nausea but no vomiting, will take pepto PRN.  She has had GERD lately, can have some acid into her throat.  Had previous negative H pylori in the past.  No NSAIDS, no ETOH, no drug use. Previously on mobic, no longer on it, 1-2 in last 2 months.  She states it is helpful if she has small, frequent meals.  She feels she has incomplete BM.  She feels she is constantly bloating.  She states everything hurts her stomach, she does not know what to eat.  No weight loss.  Has AB bloating.  In October she had bad attack of diarrhea, she was given cipro/flagyl and had thrush, yeast infection.    She  reports that she has never smoked. She has never used smokeless tobacco. She reports current alcohol use. She reports that she does not use drugs.  Current Medications:    Current Outpatient Medications (Cardiovascular):    hydrochlorothiazide (HYDRODIURIL) 25 MG tablet, Take 25 mg by mouth daily.   losartan (COZAAR) 50 MG tablet, Take 50 mg by mouth 2 (two) times daily.   Current Outpatient Medications (Analgesics):    aspirin-acetaminophen-caffeine (EXCEDRIN MIGRAINE) 250-250-65 MG tablet, Take 1 tablet by mouth 2 (two) times daily as needed for headache. (Patient not taking: Reported on 06/18/2022)   meloxicam (MOBIC) 15 MG tablet, Take 15 mg by mouth as needed for pain (muscle pain).  (Patient not taking: Reported on 06/18/2022)   Current Outpatient Medications (Other):    Ascorbic Acid (VITAMIN C)  POWD, Take 1 Dose by mouth daily as needed (stress).   Digestive Enzyme CAPS, Take 1 capsule by mouth 3 (three) times daily as needed (bloating).    ECHINACEA PO, Take 1 capsule by mouth daily as needed (immune support).    escitalopram (LEXAPRO) 10 MG tablet, Take by mouth.   LORazepam (ATIVAN) 0.5 MG tablet, Take 0.5 mg  at bedtime as needed by mouth for sleep.   MAGNESIUM PO, Take 1 tablet by mouth daily. With vitamin d   NON FORMULARY, Take 2 capsules by mouth as needed (for IBS ( triphala)).   OVER THE COUNTER MEDICATION, Take 1 capsule by mouth at bedtime as needed (constipation). Cleanse More herbal laxative   pantoprazole (PROTONIX) 20 MG tablet, Take 2 tablets (40 mg total) by mouth daily.  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    BMI 30.0-30.9,adult    Chest pain    Constipation    Cytomegaloviral disease (HCC)    Depression    Diarrhea    Dyslipidemia    Dysrhythmia    Hx LBBB   Epstein Barr virus infection    Fibromyalgia    Headache    migraines   Hormonal disorder    Hypertension    Hypertensive heart disease without congestive heart failure    Hypothyroidism, unspecified    Insomnia    Left shoulder pain    Lower abdominal pain    Malaise and fatigue    Nausea & vomiting    Neck pain    Night terrors    Nightmare disorder    Osteoporosis    Otogenic pain    Pain in shoulder region, right    Palpitations    Panic attack    Pneumonia    4 years ago   Renal cell carcinoma of left kidney (HCC)    Renal mass 08/25/2017   Sleep apnea    SOB (shortness of breath)    Vaginal discharge    Vitamin D deficiency    Allergies:  Allergies  Allergen Reactions   Penicillin G Swelling   Prolia [Denosumab] Other (See Comments)    Patient states she fainted   Cheese     Congestion    Milk-Related Compounds     Congestion    Penicillins Nausea Only    Has patient had a PCN reaction causing immediate rash, facial/tongue/throat swelling, SOB or lightheadedness with hypotension: no Has patient had a PCN reaction causing severe rash involving mucus membranes or skin necrosis: no Has patient had a PCN  reaction that required hospitalization: no Has patient had a PCN reaction occurring within the last 10 years: no If all of the above answers are "NO", then may proceed with Cephalosporin use.    Sulfa Antibiotics Hives   Sulfamethoxazole Rash     Surgical History:  She  has a past surgical history that includes Abdominal hysterectomy; scharnoma; Tubal ligation; Dilation and curettage of uterus; Ankle surgery; and Robotic assited partial nephrectomy (Left, 08/25/2017). Family History:  Her family history includes Emphysema in her brother; Hypertension in her father and mother; Leukemia in her brother; Stroke in her father and mother.  REVIEW OF SYSTEMS  : All other systems reviewed and negative except where noted in the History of Present Illness.  PHYSICAL EXAM:  BP 120/82   Pulse 75   Ht '5\' 3"'$  (1.6 m)   Wt 174 lb 8 oz (79.2 kg)   BMI 30.91 kg/m  General:   Pleasant, well developed female in no acute distress Head:   Normocephalic and atraumatic. Eyes:  sclerae anicteric,conjunctive pink  Heart:   regular rate and rhythm Pulm:  Clear anteriorly; no wheezing Abdomen:   Soft, Obese AB, Active bowel sounds. mild tenderness in the epigastrium and in the LLQ. Without guarding and Without rebound, No organomegaly appreciated. Rectal: Not evaluated Extremities:  Without edema. Msk: Symmetrical without gross deformities. Peripheral pulses intact.  Neurologic:  Alert and  oriented x4;  No focal deficits.  Skin:   Dry and intact without significant lesions or rashes. Psychiatric:  Cooperative. Normal mood and affect.  RELEVANT LABS AND IMAGING: CBC    Component Value Date/Time   WBC 7.0 08/26/2017 0348   RBC 3.74 (L) 08/26/2017 0348   HGB 11.7 (L) 08/26/2017 0348   HCT 34.5 (L) 08/26/2017 0348   PLT 166 08/26/2017 0348   MCV 92.2 08/26/2017 0348   MCH 31.3 08/26/2017 0348   MCHC 33.9 08/26/2017 0348   RDW 14.6 08/26/2017 0348    CMP     Component Value Date/Time   NA 139  05/14/2020 1105   K 4.0 05/14/2020 1105   CL 104 05/14/2020 1105   CO2 24 05/14/2020 1105   GLUCOSE 85 05/14/2020 1105   GLUCOSE 91 08/26/2017 0348   BUN 12 05/14/2020 1105   CREATININE 1.11 (H) 05/14/2020 1105   CALCIUM 9.3 05/14/2020 1105   PROT 7.2 08/16/2017 1454   ALBUMIN 4.2 08/16/2017 1454   AST 21 08/16/2017 1454   ALT 11 (L) 08/16/2017 1454   ALKPHOS 65 08/16/2017 1454   BILITOT 0.6 08/16/2017 1454   GFRNONAA 50 (L) 05/14/2020 1105   GFRAA 58 (L) 05/14/2020 [place] Maria Mcdougall, PA-C 9:15 AM

## 2022-06-18 ENCOUNTER — Ambulatory Visit: Payer: Medicare Other | Admitting: Physician Assistant

## 2022-06-18 ENCOUNTER — Encounter: Payer: Self-pay | Admitting: Physician Assistant

## 2022-06-18 VITALS — BP 120/82 | HR 75 | Ht 63.0 in | Wt 174.5 lb

## 2022-06-18 DIAGNOSIS — R110 Nausea: Secondary | ICD-10-CM | POA: Diagnosis not present

## 2022-06-18 DIAGNOSIS — Z85528 Personal history of other malignant neoplasm of kidney: Secondary | ICD-10-CM

## 2022-06-18 DIAGNOSIS — R1013 Epigastric pain: Secondary | ICD-10-CM

## 2022-06-18 DIAGNOSIS — K581 Irritable bowel syndrome with constipation: Secondary | ICD-10-CM | POA: Diagnosis not present

## 2022-06-18 DIAGNOSIS — R140 Abdominal distension (gaseous): Secondary | ICD-10-CM | POA: Diagnosis not present

## 2022-06-18 MED ORDER — PANTOPRAZOLE SODIUM 20 MG PO TBEC: 40.0000 mg | DELAYED_RELEASE_TABLET | Freq: Every day | ORAL | 1 refills | Status: DC

## 2022-06-18 NOTE — Patient Instructions (Addendum)
You have been scheduled for an endoscopy. Please follow written instructions given to you at your visit today. If you use inhalers (even only as needed), please bring them with you on the day of your procedure.   We've referred you to pelvic floor physical therapy.    Please take your proton pump inhibitor medication, pantoprazole 20 mg daily  Please take this medication 30 minutes to 1 hour before meals- this makes it more effective.  Avoid spicy and acidic foods Avoid fatty foods Limit your intake of coffee, tea, alcohol, and carbonated drinks Work to maintain a healthy weight Keep the head of the bed elevated at least 3 inches with blocks or a wedge pillow if you are having any nighttime symptoms Stay upright for 2 hours after eating Avoid meals and snacks three to four hours before bedtime   Here some information about pelvic floor dysfunction. This may be contributing to some of your symptoms. FIBER SUPPLEMENT You can do metamucil or fibercon once or twice a day but if this causes gas/bloating please switch to Benefiber or Citracel.  Fiber is good for constipation/diarrhea/irritable bowel syndrome.  It can also help with weight loss and can help lower your bad cholesterol (LDL).  Please do 1 TBSP in the morning in water, coffee, or tea.  It can take up to a month before you can see a difference with your bowel movements.  It is cheapest from [employer], [employer], [employer].   DO FIBER WITH MAGNESIUM, IF NOT HELPING ADD THE MIRLAX  Miralax is an osmotic laxative.  It only brings more water into the stool.  This is safe to take daily.  Can take up to 17 gram of miralax twice a day.  Mix with juice or coffee.  Start 1 capful at night for 3-4 days and reassess your response in 3-4 days.  You can increase and decrease the dose based on your response.  Remember, it can take up to 3-4 days to take effect OR for the effects to wear off.   I often pair this with benefiber in the morning to  help assure the stool is not too loose.   We could also refer to pelvic floor physical therapy.   Pelvic Floor Dysfunction, Female Pelvic floor dysfunction (PFD) is a condition that results when the group of muscles and connective tissues that support the organs in the pelvis (pelvic floor muscles) do not work well. These muscles and their connections form a sling that supports the colon and bladder. In women, they also support the uterus. PFD causes pelvic floor muscles to be too weak, too tight, or both. In PFD, muscle movements are not coordinated. This may cause bowel or bladder problems. It may also cause pain. What are the causes? This condition may be caused by an injury to the pelvic area or by a weakening of pelvic muscles. This often results from pregnancy and childbirth or other types of strain. In many cases, the exact cause is not known. What increases the risk? The following factors may make you more likely to develop this condition: Having chronic bladder tissue inflammation (interstitial cystitis). Being an older person. Being overweight. History of radiation treatment for cancer in the pelvic region. Previous pelvic surgery, such as removal of the uterus (hysterectomy). What are the signs or symptoms? Symptoms of this condition vary and may include: Bladder symptoms, such as: Trouble starting urination and emptying the bladder. Frequent urinary tract infections. Leaking urine when coughing, laughing, or exercising (stress  incontinence). Having to pass urine urgently or frequently. Pain when passing urine. Bowel symptoms, such as: Constipation. Urgent or frequent bowel movements. Incomplete bowel movements. Painful bowel movements. Leaking stool or gas. Unexplained genital or rectal pain. Genital or rectal muscle spasms. Low back pain. Other symptoms may include: A heavy, full, or aching feeling in the vagina. A bulge that protrudes into the vagina. Pain during or  after sex. How is this diagnosed? This condition may be diagnosed based on: Your symptoms and medical history. A physical exam. During the exam, your health care provider may check your pelvic muscles for tightness, spasm, pain, or weakness. This may include a rectal exam and a pelvic exam. In some cases, you may have diagnostic tests, such as: Electrical muscle function tests. Urine flow testing. X-ray tests of bowel function. Ultrasound of the pelvic organs. How is this treated? Treatment for this condition depends on the symptoms. Treatment options include: Physical therapy. This may include Kegel exercises to help relax or strengthen the pelvic floor muscles. Biofeedback. This type of therapy provides feedback on how tight your pelvic floor muscles are so that you can learn to control them. Internal or external massage therapy. A treatment that involves electrical stimulation of the pelvic floor muscles to help control pain (transcutaneous electrical nerve stimulation, or TENS). Sound wave therapy (ultrasound) to reduce muscle spasms. Medicines, such as: Muscle relaxants. Bladder control medicines. Surgery to reconstruct or support pelvic floor muscles may be an option if other treatments do not help. Follow these instructions at home: Activity Do your usual activities as told by your health care provider. Ask your health care provider if you should modify any activities. Do pelvic floor strengthening or relaxing exercises at home as told by your physical therapist. Lifestyle Maintain a healthy weight. Eat foods that are high in fiber, such as beans, whole grains, and fresh fruits and vegetables. Limit foods that are high in fat and processed sugars, such as fried or sweet foods. Manage stress with relaxation techniques such as yoga or meditation. General instructions If you have problems with leakage: Use absorbable pads or wear padded underwear. Wash frequently with mild  soap. Keep your genital and anal area as clean and dry as possible. Ask your health care provider if you should try a barrier cream to prevent skin irritation. Take warm baths to relieve pelvic muscle tension or spasms. Take over-the-counter and prescription medicines only as told by your health care provider. Keep all follow-up visits. How is this prevented? The cause of PFD is not always known, but there are a few things you can do to reduce the risk of developing this condition, including: Staying at a healthy weight. Getting regular exercise. Managing stress. Contact a health care provider if: Your symptoms are not improving with home care. You have signs or symptoms of PFD that get worse at home. You develop new signs or symptoms. You have signs of a urinary tract infection, such as: Fever. Chills. Increased urinary frequency. A burning feeling when urinating. You have not had a bowel movement in 3 days (constipation). Summary Pelvic floor dysfunction results when the muscles and connective tissues in your pelvic floor do not work well. These muscles and their connections form a sling that supports your colon and bladder. In women, they also support the uterus. PFD may be caused by an injury to the pelvic area or by a weakening of pelvic muscles. PFD causes pelvic floor muscles to be too weak, too tight, or a  combination of both. Symptoms may vary from person to person. In most cases, PFD can be treated with physical therapies and medicines. Surgery may be an option if other treatments do not help. This information is not intended to replace advice given to you by your health care provider. Make sure you discuss any questions you have with your health care provider. Document Revised: 10/23/2020 Document Reviewed: 10/23/2020 Elsevier Patient Education  [place].

## 2022-06-23 NOTE — Progress Notes (Signed)
Agree with assessment/plan.  Raj Travone Georg, MD [facility] [phone]  

## 2022-06-30 DIAGNOSIS — M5489 Other dorsalgia: Secondary | ICD-10-CM | POA: Diagnosis not present

## 2022-06-30 DIAGNOSIS — M2560 Stiffness of unspecified joint, not elsewhere classified: Secondary | ICD-10-CM | POA: Diagnosis not present

## 2022-06-30 DIAGNOSIS — M6281 Muscle weakness (generalized): Secondary | ICD-10-CM | POA: Diagnosis not present

## 2022-06-30 DIAGNOSIS — F419 Anxiety disorder, unspecified: Secondary | ICD-10-CM | POA: Diagnosis not present

## 2022-07-06 ENCOUNTER — Ambulatory Visit: Payer: Medicare Other

## 2022-07-06 NOTE — Therapy (Incomplete)
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    BMI 30.0-30.9,adult    Chest pain    Constipation    Cytomegaloviral disease (HCC)    Depression    Diarrhea    Dyslipidemia    Dysrhythmia    Hx LBBB   Epstein Barr virus infection    Fibromyalgia    Headache    migraines   Hormonal disorder    Hypertension    Hypertensive heart disease without congestive heart failure    Hypothyroidism, unspecified    Insomnia    Left shoulder pain    Lower abdominal pain    Malaise and fatigue    Nausea & vomiting    Neck pain    Night terrors    Nightmare disorder    Osteoporosis    Otogenic pain    Pain in shoulder region, right    Palpitations    Panic attack    Pneumonia    4 years ago   Renal cell carcinoma of left kidney (HCC)    Renal mass 08/25/2017   Sleep apnea    SOB (shortness of breath)    Vaginal discharge    Vitamin D deficiency    Past Surgical History:  Procedure Laterality Date   ABDOMINAL HYSTERECTOMY     one ovary left   ANKLE SURGERY     right ankle surgery   DILATION AND CURETTAGE OF UTERUS     ROBOTIC ASSITED PARTIAL NEPHRECTOMY Left 08/25/2017   Procedure: XI ROBOTIC ASSITED LEFT PARTIAL NEPHRECTOMY;  Surgeon: Ardis Hughs, MD;  Location: [facility];  Service: Urology;  Laterality: Left;   scharnoma     biopsy done to right abdomen   TUBAL LIGATION     Patient Active Problem List   Diagnosis Date Noted   Renal mass 08/25/2017    PCP: ***  REFERRING PROVIDER: ***  REFERRING DIAG: ***  THERAPY DIAG:  No diagnosis found.  Rationale for Evaluation and Treatment: {HABREHAB:27488}  ONSET DATE:  ***  SUBJECTIVE:                                                                                                                                                                                           SUBJECTIVE STATEMENT: *** Fluid intake: {Yes/No:304960894}  PAIN:  Are you having pain? {yes/no:20286} NPRS scale: ***/10 Pain location: {pelvic pain location:27098}  Pain type: {type:313116} Pain description: {PAIN DESCRIPTION:21022940}   Aggravating factors: *** Relieving factors: ***  PRECAUTIONS: {Therapy precautions:24002}  WEIGHT BEARING RESTRICTIONS: {Yes ***/No:24003}  FALLS:  Has patient fallen in last 6 months? {fallsyesno:27318}  LIVING ENVIRONMENT: Lives with: {OPRC lives with:25569::"lives with their family"} Lives in: {Lives in:25570} Stairs: {opstairs:27293} Has following equipment at home: {Assistive devices:23999}  OCCUPATION: ***  PLOF: {PLOF:24004}  PATIENT GOALS: ***  PERTINENT HISTORY:  *** Sexual abuse: {Yes/No:304960894}  BOWEL MOVEMENT: Pain with bowel movement: {yes/no:20286} Type of bowel movement:{PT BM type:27100} Fully empty rectum: {Yes/No:304960894} Leakage: {Yes/No:304960894} Pads: {Yes/No:304960894} Fiber supplement: {Yes/No:304960894}  URINATION: Pain with urination: {yes/no:20286} Fully empty bladder: {Yes/No:304960894} Stream: {PT urination:27102} Urgency: {Yes/No:304960894} Frequency: *** Leakage: {PT leakage:27103} Pads: {Yes/No:304960894}  INTERCOURSE: Pain with intercourse: {pain with intercourse PA:27099} Ability to have vaginal penetration:  {Yes/No:304960894} Climax: *** Marinoff Scale: ***/3  PREGNANCY: Vaginal deliveries *** Tearing {Yes***/No:304960894} C-section deliveries *** Currently pregnant {Yes***/No:304960894}  PROLAPSE: {PT prolapse:27101}   OBJECTIVE:   DIAGNOSTIC FINDINGS:  ***  PATIENT SURVEYS:  {rehab surveys:24030}  PFIQ-7 ***  COGNITION: Overall cognitive status:  {cognition:24006}     SENSATION: Light touch: {intact/deficits:24005} Proprioception: {intact/deficits:24005}  MUSCLE LENGTH: Hamstrings: Right *** deg; Left *** deg Thomas test: Right *** deg; Left *** deg  LUMBAR SPECIAL TESTS:  {lumbar special test:25242}  FUNCTIONAL TESTS:  {Functional tests:24029}  GAIT: Distance walked: *** Assistive device utilized: {Assistive devices:23999} Level of assistance: {Levels of assistance:24026} Comments: ***  POSTURE: {posture:25561}  PELVIC ALIGNMENT:  LUMBARAROM/PROM:  A/PROM A/PROM  eval  Flexion   Extension   Right lateral flexion   Left lateral flexion   Right rotation   Left rotation    (Blank rows = not tested)  LOWER EXTREMITY ROM:  {AROM/PROM:27142} ROM Right eval Left eval  Hip flexion    Hip extension    Hip abduction    Hip adduction    Hip internal rotation    Hip external rotation    Knee flexion    Knee extension    Ankle dorsiflexion    Ankle plantarflexion    Ankle inversion    Ankle eversion     (Blank rows = not tested)  LOWER EXTREMITY MMT:  MMT Right eval Left eval  Hip flexion    Hip extension    Hip abduction    Hip adduction    Hip internal rotation    Hip external rotation    Knee flexion    Knee extension    Ankle dorsiflexion    Ankle plantarflexion    Ankle inversion    Ankle eversion     PALPATION:   General  ***                External Perineal Exam ***                             Internal Pelvic Floor ***  Patient confirms identification and approves PT to assess internal pelvic floor and treatment {yes/no:20286}  PELVIC MMT:   MMT eval  Vaginal   Internal Anal Sphincter   External Anal Sphincter   Puborectalis   Diastasis Recti   (Blank rows = not tested)        TONE: ***  PROLAPSE: ***  TODAY'S TREATMENT:  DATE: ***  EVAL  ***   PATIENT EDUCATION:  Education details: *** Person educated: {Person educated:25204} Education method: {Education Method:25205} Education comprehension: {Education Comprehension:25206}  HOME EXERCISE PROGRAM: ***  ASSESSMENT:  CLINICAL IMPRESSION: Patient is a *** y.o. *** who was seen today for physical therapy evaluation and treatment for ***.   OBJECTIVE IMPAIRMENTS: {opptimpairments:25111}.   ACTIVITY LIMITATIONS: {activitylimitations:27494}  PARTICIPATION LIMITATIONS: {participationrestrictions:25113}  PERSONAL FACTORS: {Personal factors:25162} are also affecting patient's functional outcome.   REHAB POTENTIAL: {rehabpotential:25112}  CLINICAL DECISION MAKING: {clinical decision making:25114}  EVALUATION COMPLEXITY: {Evaluation complexity:25115}   GOALS: Goals reviewed with patient? {yes/no:20286}  SHORT TERM GOALS: Target date: ***  *** Baseline: Goal status: {GOALSTATUS:25110}  2.  *** Baseline:  Goal status: {GOALSTATUS:25110}  3.  *** Baseline:  Goal status: {GOALSTATUS:25110}  4.  *** Baseline:  Goal status: {GOALSTATUS:25110}  5.  *** Baseline:  Goal status: {GOALSTATUS:25110}  6.  *** Baseline:  Goal status: {GOALSTATUS:25110}  LONG TERM GOALS: Target date: ***  *** Baseline:  Goal status: {GOALSTATUS:25110}  2.  *** Baseline:  Goal status: {GOALSTATUS:25110}  3.  *** Baseline:  Goal status: {GOALSTATUS:25110}  4.  *** Baseline:  Goal status: {GOALSTATUS:25110}  5.  *** Baseline:  Goal status: {GOALSTATUS:25110}  6.  *** Baseline:  Goal status: {GOALSTATUS:25110}  PLAN:  PT FREQUENCY: {rehab frequency:25116}  PT DURATION: {rehab duration:25117}  PLANNED INTERVENTIONS: {rehab planned interventions:25118::"Therapeutic exercises","Therapeutic activity","Neuromuscular re-education","Balance training","Gait training","Patient/Family education","Self Care","Joint mobilization"}  PLAN FOR NEXT SESSION:  ***   Jule Economy, PT 07/06/2022, 7:51 AM

## 2022-07-09 ENCOUNTER — Encounter: Payer: Self-pay | Admitting: Physical Therapy

## 2022-07-09 ENCOUNTER — Ambulatory Visit: Payer: PPO | Attending: Physician Assistant | Admitting: Physical Therapy

## 2022-07-09 ENCOUNTER — Other Ambulatory Visit: Payer: Self-pay

## 2022-07-09 DIAGNOSIS — M6281 Muscle weakness (generalized): Secondary | ICD-10-CM | POA: Diagnosis not present

## 2022-07-09 DIAGNOSIS — R279 Unspecified lack of coordination: Secondary | ICD-10-CM | POA: Diagnosis not present

## 2022-07-09 DIAGNOSIS — M62838 Other muscle spasm: Secondary | ICD-10-CM
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
## 2022-07-10 DIAGNOSIS — G4733 Obstructive sleep apnea (adult) (pediatric): Secondary | ICD-10-CM | POA: Diagnosis not present

## 2022-07-12 DIAGNOSIS — G4733 Obstructive sleep apnea (adult) (pediatric): Secondary | ICD-10-CM | POA: Diagnosis not present

## 2022-07-14 DIAGNOSIS — M2560 Stiffness of unspecified joint, not elsewhere classified: Secondary | ICD-10-CM | POA: Diagnosis not present

## 2022-07-14 DIAGNOSIS — M6281 Muscle weakness (generalized): Secondary | ICD-10-CM | POA: Diagnosis not present

## 2022-07-14 DIAGNOSIS — M5489 Other dorsalgia: Secondary | ICD-10-CM | POA: Diagnosis not present

## 2022-07-15 DIAGNOSIS — I10 Essential (primary) hypertension: Secondary | ICD-10-CM | POA: Diagnosis not present

## 2022-07-15 DIAGNOSIS — G4733 Obstructive sleep apnea (adult) (pediatric): Secondary | ICD-10-CM | POA: Diagnosis not present

## 2022-07-22 DIAGNOSIS — R400 Somnolence: Secondary | ICD-10-CM | POA: Diagnosis not present

## 2022-07-22 DIAGNOSIS — J4530 Mild persistent asthma, uncomplicated: Secondary | ICD-10-CM | POA: Diagnosis not present

## 2022-07-22 DIAGNOSIS — R519 Headache, unspecified: Secondary | ICD-10-CM | POA: Diagnosis not present

## 2022-07-22 DIAGNOSIS — G4712 Idiopathic hypersomnia without long sleep time: Secondary | ICD-10-CM | POA: Diagnosis not present

## 2022-07-22 DIAGNOSIS — G4733 Obstructive sleep apnea (adult) (pediatric): Secondary | ICD-10-CM | POA: Diagnosis not present

## 2022-07-25 DIAGNOSIS — I10 Essential (primary) hypertension: Secondary | ICD-10-CM | POA: Diagnosis not present

## 2022-07-25 DIAGNOSIS — G43909 Migraine, unspecified, not intractable, without status migrainosus: Secondary | ICD-10-CM | POA: Diagnosis not present

## 2022-07-29 DIAGNOSIS — E559 Vitamin D deficiency, unspecified: Secondary | ICD-10-CM | POA: Diagnosis not present

## 2022-07-29 DIAGNOSIS — E119 Type 2 diabetes mellitus without complications: Secondary | ICD-10-CM | POA: Diagnosis not present

## 2022-07-29 DIAGNOSIS — E782 Mixed hyperlipidemia: Secondary | ICD-10-CM | POA: Diagnosis not present

## 2022-07-29 DIAGNOSIS — M810 Age-related osteoporosis without current pathological fracture: Secondary | ICD-10-CM | POA: Diagnosis not present

## 2022-07-29 DIAGNOSIS — R519 Headache, unspecified: Secondary | ICD-10-CM | POA: Diagnosis not present

## 2022-07-29 DIAGNOSIS — I10 Essential (primary) hypertension: Secondary | ICD-10-CM | POA: Diagnosis not present

## 2022-07-29 DIAGNOSIS — M62830 Muscle spasm of back: Secondary | ICD-10-CM | POA: Diagnosis not present

## 2022-07-29 DIAGNOSIS — F331 Major depressive disorder, recurrent, moderate: Secondary | ICD-10-CM | POA: Diagnosis not present

## 2022-07-29 DIAGNOSIS — D518 Other vitamin B12 deficiency anemias: Secondary | ICD-10-CM | POA: Diagnosis not present

## 2022-07-29 DIAGNOSIS — E038 Other specified hypothyroidism: Secondary | ICD-10-CM | POA: Diagnosis not present

## 2022-08-06 DIAGNOSIS — R0789 Other chest pain: Secondary | ICD-10-CM | POA: Diagnosis not present

## 2022-08-07 DIAGNOSIS — J392 Other diseases of pharynx: Secondary | ICD-10-CM | POA: Diagnosis not present

## 2022-08-07 DIAGNOSIS — D17 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 of head, face and neck: Secondary | ICD-10-CM | POA: Diagnosis not present

## 2022-08-07 DIAGNOSIS — R519 Headache, unspecified: Secondary | ICD-10-CM | POA: Diagnosis not present

## 2022-08-10 DIAGNOSIS — G4733 Obstructive sleep apnea (adult) (pediatric): Secondary | ICD-10-CM | POA: Diagnosis not present

## 2022-08-11 DIAGNOSIS — D17 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 of head, face and neck: Secondary | ICD-10-CM | POA: Diagnosis not present

## 2022-08-11 DIAGNOSIS — H7491 Unspecified disorder of right middle ear and mastoid: Secondary | ICD-10-CM | POA: Diagnosis not present

## 2022-08-11 DIAGNOSIS — G44009 Cluster headache syndrome, unspecified, not intractable: Secondary | ICD-10-CM | POA: Diagnosis not present

## 2022-08-11 DIAGNOSIS — J392 Other diseases of pharynx: Secondary | ICD-10-CM | POA: Diagnosis not present
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
## 2022-08-13 ENCOUNTER — Encounter: Payer: Self-pay | Admitting: Physical Therapy

## 2022-08-13 ENCOUNTER — Ambulatory Visit: Payer: PPO | Attending: Physician Assistant | Admitting: Physical Therapy

## 2022-08-13 DIAGNOSIS — M6281 Muscle weakness (generalized): Secondary | ICD-10-CM | POA: Insufficient documentation

## 2022-08-13 DIAGNOSIS — M62838 Other muscle spasm: Secondary | ICD-10-CM | POA: Diagnosis not present

## 2022-08-13 DIAGNOSIS — R279 Unspecified lack of coordination: Secondary | ICD-10-CM | POA: Diagnosis not present

## 2022-08-14 ENCOUNTER — Encounter: Payer: Self-pay | Admitting: Gastroenterology

## 2022-08-14 ENCOUNTER — Ambulatory Visit (AMBULATORY_SURGERY_CENTER): Payer: PPO | Admitting: Gastroenterology

## 2022-08-14 VITALS — BP 170/96 | HR 62 | Temp 98.4°F | Resp 19 | Ht 63.0 in | Wt 174.0 lb

## 2022-08-14 DIAGNOSIS — I10 Essential (primary) hypertension: Secondary | ICD-10-CM | POA: Diagnosis not present

## 2022-08-14 DIAGNOSIS — K581 Irritable bowel syndrome with constipation: Secondary | ICD-10-CM

## 2022-08-14 DIAGNOSIS — K259 Gastric ulcer, unspecified as acute or chronic, without hemorrhage or perforation: Secondary | ICD-10-CM

## 2022-08-14 DIAGNOSIS — R1013 Epigastric pain: Secondary | ICD-10-CM | POA: Diagnosis not present

## 2022-08-14 DIAGNOSIS — G4730 Sleep apnea, unspecified: Secondary | ICD-10-CM | POA: Diagnosis not present

## 2022-08-14 DIAGNOSIS — R140 Abdominal distension (gaseous): Secondary | ICD-10-CM

## 2022-08-14 DIAGNOSIS — E039 Hypothyroidism, unspecified: Secondary | ICD-10-CM | POA: Diagnosis not present

## 2022-08-14 MED ORDER — SODIUM CHLORIDE 0.9 % IV SOLN: 500.0000 mL | Freq: Once | INTRAVENOUS | Status: DC

## 2022-08-14 MED ORDER — ESOMEPRAZOLE MAGNESIUM 40 MG PO PACK: 40.0000 mg | PACK | Freq: Every day | ORAL | 4 refills | Status: DC

## 2022-08-14 NOTE — Patient Instructions (Signed)
- Resume previous diet.                           - Follow biopsies.                           - Change Protonix to Nexium 40 mg p.o. QD #90, 4RF.                            Pl take half an hour before breakfast                           - No aspirin, ibuprofen, naproxen, or other                            non-steroidal anti-inflammatory drugs.                           - Nonpharmacologic means of reflux control.                           - The findings and recommendations were discussed                            with the patient's family.                           - Return to GI clinic in 8-12 weeks with Vicie Mutters. If continued problems, further workup.  YOU HAD AN ENDOSCOPIC PROCEDURE TODAY AT [facility]:   Refer to the procedure report that was given to you for any specific questions about what was found during the examination.  If the procedure report does not answer your questions, please call your gastroenterologist to clarify.  If you requested that your care partner not be given the details of your procedure findings, then the procedure report has been included in a sealed envelope for you to review at your convenience later.  YOU SHOULD EXPECT: Some feelings of bloating in the abdomen. Passage of more gas than usual.  Walking can help get rid of the air that was put into your GI tract during the procedure and reduce the bloating. If you had a lower endoscopy (such as a colonoscopy or flexible sigmoidoscopy) you may notice spotting of blood in your stool or on the toilet paper. If you underwent a bowel prep for your procedure, you may not have a normal bowel movement for a few days.  Please Note:  You might notice some irritation and congestion in your nose or some drainage.  This is from the oxygen used during your procedure.  There is no need for concern and it should clear up in a day or so.  SYMPTOMS TO REPORT  IMMEDIATELY:   Following upper endoscopy (EGD)  Vomiting of blood or coffee ground material  New chest pain or pain under the shoulder blades  Painful or persistently difficult swallowing  New shortness of breath  Fever of 100F or higher  Black,  tarry-looking stools  For urgent or emergent issues, a gastroenterologist can be reached at any hour by calling [phone]. Do not use MyChart messaging for urgent concerns.    DIET:  We do recommend a small meal at first, but then you may proceed to your regular diet.  Drink plenty of fluids but you should avoid alcoholic beverages for 24 hours.  ACTIVITY:  You should plan to take it easy for the rest of today and you should NOT DRIVE or use heavy machinery until tomorrow (because of the sedation medicines used during the test).    FOLLOW UP: Our staff will call the number listed on your records the next business day following your procedure.  We will call around 7:15- 8:00 am to check on you and address any questions or concerns that you may have regarding the information given to you following your procedure. If we do not reach you, we will leave a message.     If any biopsies were taken you will be contacted by phone or by letter within the next 1-3 weeks.  Please call us at [phone] if you have not heard about the biopsies in 3 weeks.    SIGNATURES/CONFIDENTIALITY: You and/or your care partner have signed paperwork which will be entered into your electronic medical record.  These signatures attest to the fact that that the information above on your After Visit Summary has been reviewed and is understood.  Full responsibility of the confidentiality of this discharge information lies with you and/or your care-partner.

## 2022-08-14 NOTE — Op Note (Signed)
[facility] Patient Name: Maria Middleton Procedure Date: 08/14/2022 9:54 AM MRN: [id number] Endoscopist: Jackquline Denmark , MD, [id number] Age: 72 Referring MD:  Date of Birth: 23-Feb-1951 Gender: Female Account #: [id number] Procedure:                Upper GI endoscopy Indications:              Epigastric abdominal pain Medicines:                Monitored Anesthesia Care Procedure:                Pre-Anesthesia Assessment:                           - Prior to the procedure, a History and Physical                            was performed, and patient medications and                            allergies were reviewed. The patient's tolerance of                            previous anesthesia was also reviewed. The risks                            and benefits of the procedure and the sedation                            options and risks were discussed with the patient.                            All questions were answered, and informed consent                            was obtained. Prior Anticoagulants: The patient has                            taken no anticoagulant or antiplatelet agents. ASA                            Grade Assessment: III - A patient with severe                            systemic disease. After reviewing the risks and                            benefits, the patient was deemed in satisfactory                            condition to undergo the procedure.                           After obtaining informed consent, the endoscope was  passed under direct vision. Throughout the                            procedure, the patient's blood pressure, pulse, and                            oxygen saturations were monitored continuously. The                            GIF [id number] was introduced through the                            mouth, and advanced to the second part of duodenum.                            The upper GI endoscopy  was accomplished without                            difficulty. The patient tolerated the procedure                            well. Scope In: Scope Out: Findings:                 The lower third of the esophagus was mildly                            tortuous d/t HH, but normal with well-defined                            Z-line at 35 cm.                           A 4 cm hiatal hernia was present. No [place]                            erosions. This was confirmed on the retroflexed                            examination of the cardia. GE junction flap with                            Hills Gd IV.                           Five small non-bleeding cratered gastric ulcers                            with surrounding gastritis, with no stigmata of                            bleeding were found in the gastric antrum. The                            largest lesion was 8 mm  in largest dimension.                            Biopsies were taken with a cold forceps for                            histology.                           Multiple localized erosions without bleeding were                            found in the duodenal bulb. The second portion of                            the duodenum was normal. Biopsies for histology                            were taken with a cold forceps for evaluation of                            celiac disease. Complications:            No immediate complications. Estimated Blood Loss:     Estimated blood loss: none. Impression:               - Mod hiatal hernia.                           - Non-bleeding gastric ulcers with no stigmata of                            bleeding. Biopsied.                           - Duodenal erosions without bleeding. Biopsied. Recommendation:           - Patient has a contact number available for                            emergencies. The signs and symptoms of potential                            delayed complications were discussed  with the                            patient. Return to normal activities tomorrow.                            Written discharge instructions were provided to the                            patient.                           - Resume previous diet.                           -  Follow biopsies.                           - Change Protonix to Nexium 40 mg p.o. QD #90, 4RF.                            Pl take half an hour before breakfast                           - No aspirin, ibuprofen, naproxen, or other                            non-steroidal anti-inflammatory drugs.                           - Nonpharmacologic means of reflux control.                           - The findings and recommendations were discussed                            with the patient's family.                           - Return to GI clinic in 8-12 weeks with Vicie Mutters. If continued problems, further workup. Jackquline Denmark, MD 08/14/2022 10:25:03 AM This report has been signed electronically.

## 2022-08-14 NOTE — Progress Notes (Signed)
Patient coughing and needing oral suctioning before end of anesthesia care. Spo2 did drop, but improved with interventions. In PACu on O2 via nasal canula, waking up appropriately, with spo2 around 93%. Report to pacu rn. Vss. Care resumed by rn.

## 2022-08-14 NOTE — Progress Notes (Signed)
Called to room to assist during endoscopic procedure.  Patient ID and intended procedure confirmed with present staff. Received instructions for my participation in the procedure from the performing physician.  

## 2022-08-14 NOTE — Progress Notes (Signed)
06/18/2022 ASTELLA COFFELT [id number] [birth year]   Referring provider: Gweneth Fritter, FNP Primary GI doctor: Dr. Lyndel Safe ( [facility])   ASSESSMENT AND PLAN:    Irritable bowel syndrome with constipation possible component of pelvis floor dysfunction with history and symptoms of urinary issues as well Will treat with miralax/fiber, squatty potty.   Refer to pelvic floor PT Can consider anal manometry.  Add fiber to miralax, can consider trulance, did not do well with linzes.  Colon unremarkable 2017, recall 2027   Postprandial epigastric pain with nausea for last year Lifestyle changes discussed, avoid NSAIDS, ETOH Will do trial of PPI once daily  Will schedule EGD to evaluate for possible H. pylori, esophagitis, gastritis, peptic ulcer disease, etc.. I discussed risks of EGD with patient today, including risk of sedation, bleeding or perforation.  Patient provides understanding and gave verbal consent to proceed. If negative, can consider gastroparesis study with symptoms and history.   Right RCC S/p partial nephrectomy Recent negative staging CT, will get records from [facility]   Patient Care Team: Gweneth Fritter, FNP as PCP - General (Family Medicine) Belva Crome, MD as PCP - Cardiology (Cardiology)   HISTORY OF PRESENT ILLNESS: 72 y.o. female with a past medical history of hypertension, depression, GERD, constipation, diverticulosis, status post hysterectomy, right renal cell carcinoma 2018 s/p left partial nephrectomy with Dr. Louis Meckel and others listed below presents to establish care, [facility].   08/20/2015 colonoscopy no polyps removed recall 10 years. She has had constipation forever and IBS-C, can not tolerate linzess.  She would take magnesium and trifelo an herbel blend at night but this did not work well so she will take stimulant.    [facility], told to take fiber and magnesium.  She  continues to follow with Dr. Louis Meckel, she had recent CT with and without in Sept there.    She states she has had LLQ pain and  epigastric pain worse with any food, she has some nausea but no vomiting, will take pepto PRN.  She has had GERD lately, can have some acid into her throat.  Had previous negative H pylori in the past.  No NSAIDS, no ETOH, no drug use. Previously on mobic, no longer on it, 1-2 in last 2 months.  She states it is helpful if she has small, frequent meals.  She feels she has incomplete BM.  She feels she is constantly bloating.  She states everything hurts her stomach, she does not know what to eat.  No weight loss.  Has AB bloating.  In October she had bad attack of diarrhea, she was given cipro/flagyl and had thrush, yeast infection.      She  reports that she has never smoked. She has never used smokeless tobacco. She reports current alcohol use. She reports that she does not use drugs.   Current Medications:      Current Outpatient Medications (Cardiovascular):    hydrochlorothiazide (HYDRODIURIL) 25 MG tablet, Take 25 mg by mouth daily.   losartan (COZAAR) 50 MG tablet, Take 50 mg by mouth 2 (two) times daily.     Current Outpatient Medications (Analgesics):    aspirin-acetaminophen-caffeine (EXCEDRIN MIGRAINE) 250-250-65 MG tablet, Take 1 tablet by mouth 2 (two) times daily as needed for headache. (Patient not taking: Reported on 06/18/2022)   meloxicam (MOBIC) 15 MG tablet, Take 15 mg by mouth as needed for pain (muscle pain).  (Patient not taking:  Reported on 06/18/2022)     Current Outpatient Medications (Other):    Ascorbic Acid (VITAMIN C) POWD, Take 1 Dose by mouth daily as needed (stress).   Digestive Enzyme CAPS, Take 1 capsule by mouth 3 (three) times daily as needed (bloating).    ECHINACEA PO, Take 1 capsule by mouth daily as needed (immune support).    escitalopram (LEXAPRO) 10 MG tablet, Take by mouth.   LORazepam (ATIVAN) 0.5 MG tablet,  Take 0.5 mg at bedtime as needed by mouth for sleep.   MAGNESIUM PO, Take 1 tablet by mouth daily. With vitamin d   NON FORMULARY, Take 2 capsules by mouth as needed (for IBS ( triphala)).   OVER THE COUNTER MEDICATION, Take 1 capsule by mouth at bedtime as needed (constipation). Cleanse More herbal laxative   pantoprazole (PROTONIX) 20 MG tablet, Take 2 tablets (40 mg total) by mouth daily.   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     BMI 30.0-30.9,adult     Chest pain     Constipation     Cytomegaloviral disease (HCC)     Depression     Diarrhea     Dyslipidemia     Dysrhythmia      Hx LBBB   Epstein Barr virus infection     Fibromyalgia     Headache      migraines   Hormonal disorder     Hypertension     Hypertensive heart disease without congestive heart failure     Hypothyroidism, unspecified     Insomnia     Left shoulder pain     Lower abdominal pain     Malaise and fatigue     Nausea & vomiting     Neck pain     Night terrors     Nightmare disorder     Osteoporosis     Otogenic pain     Pain in shoulder region, right     Palpitations     Panic attack     Pneumonia      4 years ago   Renal cell carcinoma of left kidney (HCC)     Renal mass 08/25/2017   Sleep apnea     SOB (shortness of breath)     Vaginal discharge     Vitamin D deficiency      Allergies:       Allergies  Allergen Reactions   Penicillin G Swelling   Prolia [Denosumab] Other (See Comments)      Patient states she fainted   Cheese        Congestion    Milk-Related Compounds        Congestion    Penicillins Nausea Only      Has patient had a PCN reaction causing immediate rash, facial/tongue/throat swelling, SOB or lightheadedness with hypotension: no Has patient had a PCN reaction causing severe  rash involving mucus membranes or skin necrosis: no Has patient had a PCN reaction that required hospitalization: no Has patient had a PCN reaction occurring within the last 10 years: no If all of the above answers are "NO", then may proceed with Cephalosporin use.     Sulfa Antibiotics Hives   Sulfamethoxazole Rash  Surgical History:  She  has a past surgical history that includes Abdominal hysterectomy; scharnoma; Tubal ligation; Dilation and curettage of uterus; Ankle surgery; and Robotic assited partial nephrectomy (Left, 08/25/2017). Family History:  Her family history includes Emphysema in her brother; Hypertension in her father and mother; Leukemia in her brother; Stroke in her father and mother.   REVIEW OF SYSTEMS  : All other systems reviewed and negative except where noted in the History of Present Illness.   PHYSICAL EXAM: BP 120/82   Pulse 75   Ht 5' 3"$  (1.6 m)   Wt 174 lb 8 oz (79.2 kg)   BMI 30.91 kg/m  General:   Pleasant, well developed female in no acute distress Head:   Normocephalic and atraumatic. Eyes:  sclerae anicteric,conjunctive pink  Heart:   regular rate and rhythm Pulm:  Clear anteriorly; no wheezing Abdomen:   Soft, Obese AB, Active bowel sounds. mild tenderness in the epigastrium and in the LLQ. Without guarding and Without rebound, No organomegaly appreciated. Rectal: Not evaluated Extremities:  Without edema. Msk: Symmetrical without gross deformities. Peripheral pulses intact.  Neurologic:  Alert and  oriented x4;  No focal deficits.  Skin:   Dry and intact without significant lesions or rashes. Psychiatric:  Cooperative. Normal mood and affect.   RELEVANT LABS AND IMAGING: CBC Labs (Brief)          Component Value Date/Time    WBC 7.0 08/26/2017 0348    RBC 3.74 (L) 08/26/2017 0348    HGB 11.7 (L) 08/26/2017 0348    HCT 34.5 (L) 08/26/2017 0348    PLT 166 08/26/2017 0348    MCV 92.2 08/26/2017 0348    MCH 31.3 08/26/2017 0348     MCHC 33.9 08/26/2017 0348    RDW 14.6 08/26/2017 0348        CMP     Labs (Brief)          Component Value Date/Time    NA 139 05/14/2020 1105    K 4.0 05/14/2020 1105    CL 104 05/14/2020 1105    CO2 24 05/14/2020 1105    GLUCOSE 85 05/14/2020 1105    GLUCOSE 91 08/26/2017 0348    BUN 12 05/14/2020 1105    CREATININE 1.11 (H) 05/14/2020 1105    CALCIUM 9.3 05/14/2020 1105    PROT 7.2 08/16/2017 1454    ALBUMIN 4.2 08/16/2017 1454    AST 21 08/16/2017 1454    ALT 11 (L) 08/16/2017 1454    ALKPHOS 65 08/16/2017 1454    BILITOT 0.6 08/16/2017 1454    GFRNONAA 50 (L) 05/14/2020 1105    GFRAA 58 (L) 05/14/2020 [place]ia Collier, PA-C      Attending physician's note   I have taken history, reviewed the chart and examined the patient. I performed a substantive portion of this encounter, including complete performance of at least one of the key components, in conjunction with the APP. I agree with the Advanced Practitioner's note, impression and recommendations.   For EGD today   Carmell Austria, MD [facility] [phone]

## 2022-08-17 ENCOUNTER — Telehealth: Payer: Self-pay | Admitting: *Deleted

## 2022-08-17 NOTE — Telephone Encounter (Signed)
  Follow up Call-     08/14/2022    9:27 AM  Call back number  Post procedure Call Back phone  # [phone]  Permission to leave phone message Yes     Patient questions:   Message left to call us if necessary.

## 2022-08-20 ENCOUNTER — Ambulatory Visit: Payer: PPO | Admitting: Physical Therapy

## 2022-08-20 DIAGNOSIS — R279 Unspecified lack of coordination: Secondary | ICD-10-CM | POA: Diagnosis not present

## 2022-08-20 DIAGNOSIS — M62838 Other muscle spasm: Secondary | ICD-10-CM

## 2022-08-20 DIAGNOSIS — M6281 Muscle weakness (generalized): Secondary | ICD-10-CM
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
## 2022-08-26 DIAGNOSIS — R400 Somnolence: Secondary | ICD-10-CM | POA: Diagnosis not present

## 2022-08-26 DIAGNOSIS — F331 Major depressive disorder, recurrent, moderate: Secondary | ICD-10-CM | POA: Diagnosis not present

## 2022-08-26 DIAGNOSIS — D518 Other vitamin B12 deficiency anemias: Secondary | ICD-10-CM | POA: Diagnosis not present

## 2022-08-26 DIAGNOSIS — E782 Mixed hyperlipidemia: Secondary | ICD-10-CM | POA: Diagnosis not present

## 2022-08-26 DIAGNOSIS — G4733 Obstructive sleep apnea (adult) (pediatric): Secondary | ICD-10-CM | POA: Diagnosis not present

## 2022-08-26 DIAGNOSIS — G4712 Idiopathic hypersomnia without long sleep time: Secondary | ICD-10-CM | POA: Diagnosis not present

## 2022-08-26 DIAGNOSIS — J4530 Mild persistent asthma, uncomplicated: Secondary | ICD-10-CM | POA: Diagnosis not present

## 2022-08-26 DIAGNOSIS — R519 Headache, unspecified: Secondary | ICD-10-CM | POA: Diagnosis not present

## 2022-08-26 DIAGNOSIS — I10 Essential (primary) hypertension: Secondary | ICD-10-CM | POA: Diagnosis not present

## 2022-08-26 DIAGNOSIS — E119 Type 2 diabetes mellitus without complications: Secondary | ICD-10-CM | POA: Diagnosis not present

## 2022-08-26 DIAGNOSIS — E038 Other specified hypothyroidism: Secondary | ICD-10-CM | POA: Diagnosis not present

## 2022-08-26 DIAGNOSIS — E559 Vitamin D deficiency, unspecified: Secondary | ICD-10-CM | POA: Diagnosis not present

## 2022-08-26 DIAGNOSIS — M810 Age-related osteoporosis without current pathological fracture: Secondary | ICD-10-CM | POA: Diagnosis not present

## 2022-08-27 ENCOUNTER — Encounter: Payer: PPO | Admitting: Physical Therapy

## 2022-08-30 ENCOUNTER — Encounter: Payer: Self-pay | Admitting: Gastroenterology

## 2022-09-03 ENCOUNTER — Ambulatory Visit: Payer: PPO | Attending: Physician Assistant | Admitting: Physical Therapy

## 2022-09-03 DIAGNOSIS — M62838 Other muscle spasm: Secondary | ICD-10-CM | POA: Insufficient documentation

## 2022-09-03 DIAGNOSIS — M6281 Muscle weakness (generalized): Secondary | ICD-10-CM | POA: Insufficient documentation

## 2022-09-03 DIAGNOSIS — R279 Unspecified lack of coordination: Secondary | ICD-10-CM | POA: Insufficient documentation
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
## 2022-09-10 ENCOUNTER — Ambulatory Visit: Payer: PPO | Admitting: Physical Therapy

## 2022-09-18 DIAGNOSIS — R109 Unspecified abdominal pain: Secondary | ICD-10-CM | POA: Diagnosis not present

## 2022-09-27 DIAGNOSIS — E559 Vitamin D deficiency, unspecified: Secondary | ICD-10-CM | POA: Diagnosis not present

## 2022-09-27 DIAGNOSIS — F419 Anxiety disorder, unspecified: Secondary | ICD-10-CM | POA: Diagnosis not present

## 2022-09-27 DIAGNOSIS — E038 Other specified hypothyroidism: Secondary | ICD-10-CM | POA: Diagnosis not present

## 2022-09-27 DIAGNOSIS — D518 Other vitamin B12 deficiency anemias: Secondary | ICD-10-CM | POA: Diagnosis not present

## 2022-09-27 DIAGNOSIS — E119 Type 2 diabetes mellitus without complications: Secondary | ICD-10-CM | POA: Diagnosis not present

## 2022-09-27 DIAGNOSIS — E782 Mixed hyperlipidemia: Secondary | ICD-10-CM | POA: Diagnosis not present

## 2022-09-27 DIAGNOSIS — M810 Age-related osteoporosis without current pathological fracture: Secondary | ICD-10-CM | POA: Diagnosis not present

## 2022-09-27 DIAGNOSIS — F331 Major depressive disorder, recurrent, moderate: Secondary | ICD-10-CM | POA: Diagnosis not present

## 2022-09-27 DIAGNOSIS — I10 Essential (primary) hypertension: Secondary | ICD-10-CM | POA: Diagnosis not present

## 2022-10-07 NOTE — Progress Notes (Signed)
10/13/2022 Maria Middleton [id number] 04-02-1951  Referring provider: Audie Pinto, FNP Primary GI doctor: Dr. Chales Abrahams ( [facility])  ASSESSMENT AND PLAN:   Irritable bowel syndrome with constipation, Pelvic floor dysfunction Can consider anal manometry.  Add fiber to miralax, continue magnesium Continue pelvic floor PT, has helped Colon unremarkable 2017, recall 2027  Gastric and duodenal ulcers s/p EGD 07/2022 showing H pylori negative gastric ulcers Moderate Hiatal hernia Has been on nexium since EGD Still some postprandial epigastric pain with nausea, worse after fatty foods Has had some weight gain, added back on tumeric - will get RUQ [place] to rule out GB  - Increase nexium to twice a day for 6-8 weeks, then go back to once a day and add on pepcid at night - follow up 2 months - may need repeat EGD at that time to evaluate for healing.  - can still consider GES   Right RCC S/p partial nephrectomy Recent negative staging CT, will get records from [facility]  Morbid obesity  Body mass index is 31.04 kg/m.  -Patient has been advised to make an attempt to improve diet and exercise patterns to aid in weight loss- will refer to weight loss clinic -Recommended diet heavy in fruits and veggies and low in animal meats, cheeses, and dairy products, appropriate calorie intake   Patient Care Team: Hortencia Conradi, NP as PCP - General Lyn Records, MD (Inactive) as PCP - Cardiology (Cardiology)  HISTORY OF PRESENT ILLNESS: 72 y.o. female with a past medical history of hypertension, depression, GERD, constipation, diverticulosis, status post hysterectomy, right renal cell carcinoma 2018 s/p left partial nephrectomy with Dr. Marlou Porch and others listed below presents to establish care, [facility].  08/20/2015 colonoscopy no polyps removed recall 10 years. She has had constipation and IBS-C, can not tolerate linzess.  She  would take magnesium and trifelo an herbel blend at night but this did not work well so she will take stimulant.  She continues to follow with Dr. Marlou Porch, she had recent CT with and without in Sept there.  07/2022 EGD with Dr. Chales Abrahams for GERD/nausea showed moderate hiatal, non bleeding gastric ulcers, duodenal erosions without bleeding. Negative H pylori gastric, negative celiac.   She started back on tumeric, she is not on NSAIDS.  She states she has done well with her diet. Better if she avoids fatty foods or meats.  She is on nexium once daily.  She states recently she ate fried chicken at her church and it caused worsening epigastric, LLQ pain and nausea.  Can be better if she eats small frequent meals.  She continues to have some esophageal/chest discomfort.  She states she has gained weight and has been more lethargic.  She has OSA, on CPAP, still waking up tired.  Bm's have been a little more regular, daily. She has done pelvic floor exercises that helped.  She is still on magnesium daily.  Brother died within the last 2 months and cousin died, has had increased stressed.  No NSAIDS, no ETOH, no drug use.    She  reports that she has never smoked. She has never used smokeless tobacco. She reports current alcohol use. She reports that she does not use drugs.  Current Medications:    Current Outpatient Medications (Cardiovascular):    hydrochlorothiazide (HYDRODIURIL) 25 MG tablet, Take 25 mg by mouth daily.   losartan (COZAAR) 50 MG tablet, Take 50 mg by mouth 2 (two)  times daily.    Current Outpatient Medications (Hematological):    Cyanocobalamin (B-12) 50 MCG TABS, Take by mouth.  Current Outpatient Medications (Other):    Ascorbic Acid (VITAMIN C) POWD, Take 1 Dose by mouth daily as needed (stress).   escitalopram (LEXAPRO) 10 MG tablet, Take by mouth.   esomeprazole (NEXIUM) 40 MG packet, Take 40 mg by mouth daily before breakfast. Take 1 tablet 30 mins before  breakfast   LORazepam (ATIVAN) 0.5 MG tablet, Take 0.5 mg by mouth at bedtime as needed for sleep.   MAGNESIUM PO, Take 1 tablet by mouth daily. With vitamin d   milk thistle 175 MG tablet, Take 175 mg by mouth daily.   NON FORMULARY, Take 2 capsules by mouth as needed (for IBS ( triphala)).   OVER THE COUNTER MEDICATION, Take 1 capsule by mouth at bedtime as needed (constipation). Cleanse More herbal laxative   Turmeric (QC TUMERIC COMPLEX PO), Take by mouth.   zaleplon (SONATA) 10 MG capsule, Take 10 mg by mouth at bedtime.  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    BMI 30.0-30.9,adult    Chest pain    Constipation    Cytomegaloviral disease    Depression    Diarrhea    Dyslipidemia    Dysrhythmia    Hx LBBB   Epstein Barr virus infection    Fibromyalgia    Headache    migraines   Hormonal disorder    Hypertension    Hypertensive heart disease without congestive heart failure    Hypothyroidism, unspecified    Insomnia    Left shoulder pain    Lower abdominal pain    Malaise and fatigue    Nausea & vomiting    Neck pain    Night terrors    Nightmare disorder    Osteoporosis    Otogenic pain    Pain in shoulder region, right    Palpitations    Panic attack    Pneumonia    4 years ago   Renal cell carcinoma of left kidney    Renal mass 08/25/2017   Sleep apnea    SOB (shortness of breath)    Vaginal discharge    Vitamin D deficiency    Allergies:  Allergies  Allergen Reactions   Penicillin G Swelling   Prolia [Denosumab] Other (See Comments)    Patient states she fainted   Cheese     Congestion    Milk-Related Compounds     Congestion    Penicillins Nausea Only    Has patient had a PCN reaction causing immediate rash, facial/tongue/throat swelling, SOB or lightheadedness with  hypotension: no Has patient had a PCN reaction causing severe rash involving mucus membranes or skin necrosis: no Has patient had a PCN reaction that required hospitalization: no Has patient had a PCN reaction occurring within the last 10 years: no If all of the above answers are "NO", then may proceed with Cephalosporin use.    Sulfa Antibiotics Hives   Sulfamethoxazole Rash     Surgical History:  She  has a past surgical history that includes Abdominal hysterectomy; scharnoma; Tubal ligation; Dilation and curettage of uterus; Ankle surgery; and Robotic assited partial nephrectomy (Left, 08/25/2017). Family History:  Her family history includes Emphysema in her brother;  Hypertension in her father and mother; Leukemia in her brother; Stroke in her father and mother.  REVIEW OF SYSTEMS  : All other systems reviewed and negative except where noted in the History of Present Illness.  PHYSICAL EXAM: BP (!) 128/90   Pulse (!) 59   Ht 5' 3.5" (1.613 m)   Wt 178 lb (80.7 kg)   BMI 31.04 kg/m  General:   Pleasant, well developed female in no acute distress Head:   Normocephalic and atraumatic. Eyes:  sclerae anicteric,conjunctive pink  Heart:   regular rate and rhythm Pulm:  Clear anteriorly; no wheezing Abdomen:   Soft, Obese AB, Active bowel sounds. mild tenderness in the epigastrium and in the LLQ. Without guarding and Without rebound, No organomegaly appreciated. Rectal: Not evaluated Extremities:  Without edema. Msk: Symmetrical without gross deformities. Peripheral pulses intact.  Neurologic:  Alert and  oriented x4;  No focal deficits.  Skin:   Dry and intact without significant lesions or rashes. Psychiatric:  Cooperative. Normal mood and affect.  RELEVANT LABS AND IMAGING: CBC    Component Value Date/Time   WBC 7.0 08/26/2017 0348   RBC 3.74 (L) 08/26/2017 0348   HGB 11.7 (L) 08/26/2017 0348   HCT 34.5 (L) 08/26/2017 0348   PLT 166 08/26/2017 0348   MCV 92.2 08/26/2017  0348   MCH 31.3 08/26/2017 0348   MCHC 33.9 08/26/2017 0348   RDW 14.6 08/26/2017 0348    CMP     Component Value Date/Time   NA 139 05/14/2020 1105   K 4.0 05/14/2020 1105   CL 104 05/14/2020 1105   CO2 24 05/14/2020 1105   GLUCOSE 85 05/14/2020 1105   GLUCOSE 91 08/26/2017 0348   BUN 12 05/14/2020 1105   CREATININE 1.11 (H) 05/14/2020 1105   CALCIUM 9.3 05/14/2020 1105   PROT 7.2 08/16/2017 1454   ALBUMIN 4.2 08/16/2017 1454   AST 21 08/16/2017 1454   ALT 11 (L) 08/16/2017 1454   ALKPHOS 65 08/16/2017 1454   BILITOT 0.6 08/16/2017 1454   GFRNONAA 50 (L) 05/14/2020 1105   GFRAA 58 (L) 05/14/2020 1105     Doree AlbeeAmanda R Aaylah Pokorny, PA-C 8:49 AM

## 2022-10-13 ENCOUNTER — Ambulatory Visit (INDEPENDENT_AMBULATORY_CARE_PROVIDER_SITE_OTHER): Payer: PPO | Admitting: Physician Assistant

## 2022-10-13 ENCOUNTER — Encounter: Payer: Self-pay | Admitting: Physician Assistant

## 2022-10-13 VITALS — BP 128/90 | HR 59 | Ht 63.5 in | Wt 178.0 lb

## 2022-10-13 DIAGNOSIS — R110 Nausea: Secondary | ICD-10-CM | POA: Diagnosis not present

## 2022-10-13 DIAGNOSIS — Z85528 Personal history of other malignant neoplasm of kidney: Secondary | ICD-10-CM

## 2022-10-13 DIAGNOSIS — K581 Irritable bowel syndrome with constipation: Secondary | ICD-10-CM | POA: Diagnosis not present

## 2022-10-13 DIAGNOSIS — R1013 Epigastric pain: Secondary | ICD-10-CM

## 2022-10-13 DIAGNOSIS — Z6830 Body mass index (BMI) 30.0-30.9, adult: Secondary | ICD-10-CM

## 2022-10-13 DIAGNOSIS — K257 Chronic gastric ulcer without hemorrhage or perforation: Secondary | ICD-10-CM

## 2022-10-13 MED ORDER — FAMOTIDINE 40 MG PO TABS: 40.0000 mg | ORAL_TABLET | Freq: Every evening | ORAL | 0 refills | Status: DC | PRN

## 2022-10-13 MED ORDER — ESOMEPRAZOLE MAGNESIUM 40 MG PO PACK: 40.0000 mg | PACK | Freq: Every day | ORAL | 4 refills | Status: DC

## 2022-10-13 NOTE — Patient Instructions (Signed)
_______________________________________________________  If your blood pressure at your visit was 140/90 or greater, please contact your primary care physician to follow up on this.  _______________________________________________________  If you are age 73 or older, your body mass index should be between 23-30. Your Body mass index is 31.04 kg/m. If this is out of the aforementioned range listed, please consider follow up with your Primary Care Provider.  The [facility] providers would like to encourage you to use [facility] to communicate with providers for non-urgent requests or questions.  Due to long hold times on the telephone, sending your provider a message by [facility] may be a faster and more efficient way to get a response.  Please allow 48 business hours for a response.  Please remember that this is for non-urgent requests.  _______________________________________________________  Bonita Quin have been scheduled for an abdominal ultrasound at [facility] (1st floor of hospital) on 10-20-22 at 10:30am. Please arrive 15 minutes prior to your appointment for registration. Make certain not to have anything to eat or drink 6 hours prior to your appointment. Should you need to reschedule your appointment, please contact radiology at [phone]. This test typically takes about 30 minutes to perform.  Due to recent changes in healthcare laws, you may see the results of your imaging and laboratory studies on MyChart before your provider has had a chance to review them.  We understand that in some cases there may be results that are confusing or concerning to you. Not all laboratory results come back in the same time frame and the provider may be waiting for multiple results in order to interpret others.  Please give [place] 48 hours in order for your provider to thoroughly review all the results before contacting the office for clarification of your results.   You are scheduled to follow up on 12-09-22 at  9am.  Please take your proton pump inhibitor medication, nexium to TWICE a day for 6-8 weeks, then can start on nexium in the morning and pepcid or famotidine at night.   Please take this medication 30 minutes to 1 hour before meals- this makes it more effective.  Avoid spicy and acidic foods Avoid fatty foods Limit your intake of coffee, tea, alcohol, and carbonated drinks Work to maintain a healthy weight Keep the head of the bed elevated at least 3 inches with blocks or a wedge pillow if you are having any nighttime symptoms Stay upright for 2 hours after eating Avoid meals and snacks three to four hours before bedtime  Stop tumeric No aleve, ibuprofen, goody powders, as these are antiinflammatories and can cause inflammation in your stomach, increase bleeding risk and cause ulcers.  You can talk with PCP about alternative pain options.  Can do tyelnol max 3000 mg a day, salon pas patches are over the counter and voltern gel is topical antiinflammatory that is safe.    General weight loss tips - will refer you to weight loss center  Drink 1/2 your body weight in fluid ounces of water daily; drink a tall glass of water 30 min before meals Before eating, ask yourself if you are hungry, bored, stressed or even just thirsty  Always eat at the table; avoid eating while distracted Don't eat until you're stuffed- eat slowly, listen to your stomach and eat until you are 80% full  Aim for each meal or snack to have fiber + protein + healthy fats = satisfying  Try eating off of a salad plate; wait 10 min after  finishing before going back for seconds Start by eating the vegetables on your plate; aim for 16% of your meals to be fruits and vegetables Then eat your protein - lean meats (grass fed if possible), fish, beans, nuts/seeds in moderation Eat your carbs/starch last ONLY if you still are hungry, and choose high fiber options as much as possible (brown rice, oats, quinoa, farro, etc). If you  get full before finishing, don't feel bad about leaving some on your plate Avoid sugar, flour, processed - the closer it looks to it's original form in nature, typically the better it is for you, and will avoid triggering a glucose spike that will store your calories as fat and make you more hungry Splurge in moderation if needed - "assign" meals when you get to splurge and have the "bad stuff" - I like to follow a 80% - 20% plan- "healthy" choices 80 % of the time, "splurge" choices in moderation 20% of the time Simple equation is: Calories out > calories in = weight loss - even if you eat the bad stuff, if you limit portions, you will still lose weight, just not as fast Make sustainable changes - this is a journey to build lifelong healthy habits! A healthy change should help you feel energized, focused and light on your feet. If you make a change and you feel miserable doing it, please ask for help :)  Thank you for entrusting me with your care and choosing [facility].  Quentin Mulling, PA

## 2022-10-20 ENCOUNTER — Ambulatory Visit (HOSPITAL_COMMUNITY): Admission: RE | Admit: 2022-10-20 | Payer: PPO | Source: Ambulatory Visit

## 2022-10-21 NOTE — Progress Notes (Signed)
Agree with assessment/plan.  Raj Lichelle Viets, MD [facility] [phone]  

## 2022-10-29 ENCOUNTER — Encounter (INDEPENDENT_AMBULATORY_CARE_PROVIDER_SITE_OTHER): Payer: Self-pay | Admitting: Internal Medicine

## 2022-10-29 ENCOUNTER — Ambulatory Visit (INDEPENDENT_AMBULATORY_CARE_PROVIDER_SITE_OTHER): Payer: PPO | Admitting: Internal Medicine

## 2022-10-29 ENCOUNTER — Ambulatory Visit (HOSPITAL_COMMUNITY)
Admission: RE | Admit: 2022-10-29 | Discharge: 2022-10-29 | Disposition: A | Discharge status: Home / Self Care | Payer: PPO | Source: Ambulatory Visit | Attending: Physician Assistant | Admitting: Physician Assistant

## 2022-10-29 VITALS — BP 132/86 | HR 71 | Temp 97.9°F | Ht 63.0 in | Wt 171.0 lb

## 2022-10-29 DIAGNOSIS — I10 Essential (primary) hypertension: Secondary | ICD-10-CM | POA: Insufficient documentation

## 2022-10-29 DIAGNOSIS — R944 Abnormal results of kidney function studies: Secondary | ICD-10-CM | POA: Diagnosis not present

## 2022-10-29 DIAGNOSIS — Z0289 Encounter for other administrative examinations: Secondary | ICD-10-CM

## 2022-10-29 DIAGNOSIS — Z6830 Body mass index (BMI) 30.0-30.9, adult: Secondary | ICD-10-CM

## 2022-10-29 DIAGNOSIS — R110 Nausea: Secondary | ICD-10-CM | POA: Diagnosis present

## 2022-10-29 DIAGNOSIS — R1013 Epigastric pain: Secondary | ICD-10-CM

## 2022-10-29 DIAGNOSIS — R7303 Prediabetes: Secondary | ICD-10-CM | POA: Diagnosis not present

## 2022-10-29 DIAGNOSIS — E669 Obesity, unspecified: Secondary | ICD-10-CM | POA: Insufficient documentation

## 2022-10-29 NOTE — Assessment & Plan Note (Signed)
Most recent A1c is  Lab Results  Component Value Date   HGBA1C 6.0 (H) 02/27/2020  . Patient informed of disease state and risk of progression. This may contribute to abnormal cravings, fatigue and diabetes complications without having diabetes.   We reviewed treatment options which include losing 7 to 10% of body weight, increasing physical activity to a 150 minutes a week of moderate intensity.She may also be a candidate for pharmacoprophylaxis with metformin or incretin mimetic.

## 2022-10-29 NOTE — Progress Notes (Signed)
Office: [phone]  /  Fax: [phone]   Initial Visit  Maria Middleton was seen in clinic today to evaluate for obesity. She is interested in losing weight to improve overall health and reduce the risk of weight related complications. She presents today to review program treatment options, initial physical assessment, and evaluation.     She was referred by: Specialist Gastroenterologist  When asked what else they would like to accomplish? She states: Improve energy levels and physical activity, Improve existing medical conditions, Reduce number of medications, and Improve quality of life  Weight history: started to gain weight after menopause  When asked how has your weight affected you? She states: Contributed to medical problems, Contributed to orthopedic problems or mobility issues, Having fatigue, Having poor endurance, and Problems with eating patterns  Some associated conditions: Hypertension, Arthritis:multiple joints, Prediabetes, and Vitamin D Deficiency  Contributing factors: Family history, Disruption of circadian rhythm, Stress, Reduced physical activity, and Menopause  Weight promoting medications identified: None  Current nutrition plan: Low-carb  Current level of physical activity: Walking  Current or previous pharmacotherapy: None  Response to medication: Never tried medications   Past medical history includes:   Past Medical History:  Diagnosis Date   Abdominal bloating    Allergic rhinitis    Allergic to food    Anemia, unspecified    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    BMI 30.0-30.9,adult    Chest pain    Constipation    Cytomegaloviral disease (HCC)    Depression    Diarrhea    Dyslipidemia    Dysrhythmia    Hx LBBB   Epstein Barr virus infection    Fibromyalgia    Headache    migraines   Hormonal disorder    Hypertension    Hypertensive heart disease without congestive heart failure     Hypothyroidism, unspecified    Insomnia    Left shoulder pain    Lower abdominal pain    Malaise and fatigue    Nausea & vomiting    Neck pain    Night terrors    Nightmare disorder    Osteoporosis    Otogenic pain    Pain in shoulder region, right    Palpitations    Panic attack    Pneumonia    4 years ago   Renal cell carcinoma of left kidney (HCC)    Renal mass 08/25/2017   Sleep apnea    SOB (shortness of breath)    Vaginal discharge    Vitamin D deficiency      Objective:   BP 132/86   Pulse 71   Temp 97.9 F (36.6 C)   Ht 5\' 3"  (1.6 m)   Wt 171 lb (77.6 kg)   SpO2 97%   BMI 30.29 kg/m  She was weighed on the bioimpedance scale: Body mass index is 30.29 kg/m.  Peak Weight:178 , Body Fat%:42, Visceral Fat Rating:12, Weight trend over the last 12 months: Increasing  General:  Alert, oriented and cooperative. Patient is in no acute distress.  Respiratory: Normal respiratory effort, no problems with respiration noted   Gait: able to ambulate independently  Mental Status: Normal mood and affect. Normal behavior. Normal judgment and thought content.   DIAGNOSTIC DATA REVIEWED:  BMET    Component Value Date/Time   NA 139 05/14/2020 1105   K 4.0 05/14/2020 1105   CL 104 05/14/2020 1105   CO2 24 05/14/2020 1105  GLUCOSE 85 05/14/2020 1105   GLUCOSE 91 08/26/2017 0348   BUN 12 05/14/2020 1105   CREATININE 1.11 (H) 05/14/2020 1105   CALCIUM 9.3 05/14/2020 1105   GFRNONAA 50 (L) 05/14/2020 1105   GFRAA 58 (L) 05/14/2020 1105   Lab Results  Component Value Date   HGBA1C 6.0 (H) 02/27/2020   No results found for: "INSULIN" CBC    Component Value Date/Time   WBC 7.0 08/26/2017 0348   RBC 3.74 (L) 08/26/2017 0348   HGB 11.7 (L) 08/26/2017 0348   HCT 34.5 (L) 08/26/2017 0348   PLT 166 08/26/2017 0348   MCV 92.2 08/26/2017 0348   MCH 31.3 08/26/2017 0348   MCHC 33.9 08/26/2017 0348   RDW 14.6 08/26/2017 0348   Iron/TIBC/Ferritin/ %Sat No results  found for: "IRON", "TIBC", "FERRITIN", "IRONPCTSAT" Lipid Panel     Component Value Date/Time   CHOL 191 02/27/2020 0926   TRIG 62 02/27/2020 0926   HDL 68 02/27/2020 0926   CHOLHDL 2.8 02/27/2020 0926   LDLCALC 111 (H) 02/27/2020 0926   Hepatic Function Panel     Component Value Date/Time   PROT 7.2 08/16/2017 1454   ALBUMIN 4.2 08/16/2017 1454   AST 21 08/16/2017 1454   ALT 11 (L) 08/16/2017 1454   ALKPHOS 65 08/16/2017 1454   BILITOT 0.6 08/16/2017 1454   No results found for: "TSH"   Assessment and Plan:   Primary hypertension Assessment & Plan: Blood pressure at goal for age and risk category.  On losartan without adverse effects.  Most recent renal parameters reviewed which showed a decrease in GFR but no personal history of chronic kidney disease.  Further investigation is warranted.  Continue with weight loss therapy.  Monitor for symptoms of orthostasis while losing weight. Continue current regimen and home monitoring for a goal blood pressure of 120/80.    Prediabetes Assessment & Plan: Most recent A1c is  Lab Results  Component Value Date   HGBA1C 6.0 (H) 02/27/2020  . Patient informed of disease state and risk of progression. This may contribute to abnormal cravings, fatigue and diabetes complications without having diabetes.   We reviewed treatment options which include losing 7 to 10% of body weight, increasing physical activity to a 150 minutes a week of moderate intensity.She may also be a candidate for pharmacoprophylaxis with metformin or incretin mimetic.     Class 1 obesity with serious comorbidity and body mass index (BMI) of 30.0 to 30.9 in adult, unspecified obesity type Assessment & Plan: We reviewed weight, biometrics, associated medical conditions and contributing factors with patient. She would benefit from weight loss therapy via a modified calorie, low-carb, high-protein nutritional plan tailored to their REE (resting energy expenditure)  which will be determined by indirect calorimetry.  We will also assess for cardiometabolic risk and nutritional derangements via fasting serologies at her next appointment.   Decreased GFR Assessment & Plan: Most recent available GFR was 50 in 2021.  She probably has blood work outside of network.  She has hypertension and prediabetes and is also affected by obesity all of these conditions may increase the risk of chronic kidney disease.  We will further explore previous evaluations and guide further workup at her intake appointment.         Obesity Treatment / Action Plan:  Patient will work on garnering support from family and friends to begin weight loss journey. Will work on eliminating or reducing the presence of highly palatable, calorie dense foods in the home.  Will complete provided nutritional and psychosocial assessment questionnaire before the next appointment. Will be scheduled for indirect calorimetry to determine resting energy expenditure in a fasting state.  This will allow [place] to create a reduced calorie, high-protein meal plan to promote loss of fat mass while preserving muscle mass. Counseled on the health benefits of losing 5%-15% of total body weight. Will work on improving sleep hygiene and trying to obtain at least 7 hours of sleep. Was counseled on nutritional approaches to weight loss and benefits of complex carbs and high quality protein as part of nutritional weight management. Was counseled on pharmacotherapy and role as an adjunct in weight management.   Obesity Education Performed Today:  She was weighed on the bioimpedance scale and results were discussed and documented in the synopsis.  We discussed obesity as a disease and the importance of a more detailed evaluation of all the factors contributing to the disease.  We discussed the importance of long term lifestyle changes which include nutrition, exercise and behavioral modifications as well as the  importance of customizing this to her specific health and social needs.  We discussed the benefits of reaching a healthier weight to alleviate the symptoms of existing conditions and reduce the risks of the biomechanical, metabolic and psychological effects of obesity.  ANY MCNEICE appears to be in the action stage of change and states they are ready to start intensive lifestyle modifications and behavioral modifications.  30 minutes was spent today on this visit including the above counseling, pre-visit chart review, and post-visit documentation.  Reviewed by clinician on day of visit: allergies, medications, problem list, medical history, surgical history, family history, social history, and previous encounter notes pertinent to obesity diagnosis.   Worthy Rancher, MD

## 2022-10-29 NOTE — Assessment & Plan Note (Signed)
Blood pressure at goal for age and risk category.  On losartan without adverse effects.  Most recent renal parameters reviewed which showed a decrease in GFR but no personal history of chronic kidney disease.  Further investigation is warranted.  Continue with weight loss therapy.  Monitor for symptoms of orthostasis while losing weight. Continue current regimen and home monitoring for a goal blood pressure of 120/80.

## 2022-10-29 NOTE — Assessment & Plan Note (Signed)
Most recent available GFR was 50 in 2021.  She probably has blood work outside of network.  She has hypertension and prediabetes and is also affected by obesity all of these conditions may increase the risk of chronic kidney disease.  We will further explore previous evaluations and guide further workup at her intake appointment.

## 2022-10-29 NOTE — Assessment & Plan Note (Signed)
We reviewed weight, biometrics, associated medical conditions and contributing factors with patient. She would benefit from weight loss therapy via a modified calorie, low-carb, high-protein nutritional plan tailored to their REE (resting energy expenditure) which will be determined by indirect calorimetry.  We will also assess for cardiometabolic risk and nutritional derangements via fasting serologies at her next appointment. 

## 2022-10-30 ENCOUNTER — Ambulatory Visit (HOSPITAL_COMMUNITY): Payer: PPO

## 2022-11-03 ENCOUNTER — Telehealth: Payer: Self-pay | Admitting: Physician Assistant

## 2022-11-03 NOTE — Telephone Encounter (Signed)
Patient called stating that she was advised to take Nexium 2 times a day until starting her Pepcid. States she does not think she can continue taking nexium because it is making her feel bad. She is also requesting call back to discuss the results of the tests that were done on her gallbladder and liver. Please advise, thank you.

## 2022-11-03 NOTE — Telephone Encounter (Signed)
Left message for pt to call back  °

## 2022-11-04 ENCOUNTER — Ambulatory Visit (INDEPENDENT_AMBULATORY_CARE_PROVIDER_SITE_OTHER): Payer: PPO | Admitting: Internal Medicine

## 2022-11-04 ENCOUNTER — Encounter (INDEPENDENT_AMBULATORY_CARE_PROVIDER_SITE_OTHER): Payer: Self-pay | Admitting: Internal Medicine

## 2022-11-04 VITALS — BP 137/83 | HR 62 | Temp 97.9°F | Ht 63.0 in | Wt 171.0 lb

## 2022-11-04 DIAGNOSIS — E669 Obesity, unspecified: Secondary | ICD-10-CM

## 2022-11-04 DIAGNOSIS — R0602 Shortness of breath: Secondary | ICD-10-CM | POA: Diagnosis not present

## 2022-11-04 DIAGNOSIS — R5383 Other fatigue: Secondary | ICD-10-CM | POA: Diagnosis not present

## 2022-11-04 DIAGNOSIS — R7303 Prediabetes: Secondary | ICD-10-CM | POA: Diagnosis not present

## 2022-11-04 DIAGNOSIS — R944 Abnormal results of kidney function studies: Secondary | ICD-10-CM

## 2022-11-04 DIAGNOSIS — I10 Essential (primary) hypertension: Secondary | ICD-10-CM | POA: Diagnosis not present

## 2022-11-04 DIAGNOSIS — F32A Depression, unspecified: Secondary | ICD-10-CM | POA: Diagnosis not present

## 2022-11-04 DIAGNOSIS — Z6830 Body mass index (BMI) 30.0-30.9, adult: Secondary | ICD-10-CM

## 2022-11-04 DIAGNOSIS — Z1331 Encounter for screening for depression: Secondary | ICD-10-CM | POA: Insufficient documentation

## 2022-11-04 DIAGNOSIS — E668 Other obesity: Secondary | ICD-10-CM

## 2022-11-04 NOTE — Assessment & Plan Note (Signed)
Most recent available GFR was 50 in 2021.  She probably has blood work outside of network.  She has a history of renal cancer status post partial nephrectomy, hypertension and prediabetes and is also affected by obesity all of these conditions may increase the risk of chronic kidney disease.  We will check renal parameters today.  Her blood pressure is relatively well-controlled for age.  She avoids nephrotoxins.  Patient counseled on maintaining adequate hydration

## 2022-11-04 NOTE — Progress Notes (Signed)
Chief Complaint:   OBESITY Maria Middleton (MR# [id number]) is a 72 y.o. female who presents for evaluation and treatment of obesity and related comorbidities. Current BMI is Body mass index is 30.29 kg/m. Maria Middleton has been struggling with her weight for many years and has been unsuccessful in either losing weight, maintaining weight loss, or reaching her healthy weight goal.  Maria Middleton is currently in the action stage of change and ready to dedicate time achieving and maintaining a healthier weight. Maria Middleton is interested in becoming our patient and working on intensive lifestyle modifications including (but not limited to) diet and exercise for weight loss.  Maria Middleton's habits were reviewed today and are as follows: Her family eats meals together, her desired weight loss is 21-26 lbs, she started gaining weight in her late 33's, her heaviest weight ever was 179 pounds, she has significant food cravings issues, she snacks frequently in the evenings, she skips meals frequently, she is frequently drinking liquids with calories, she frequently makes poor food choices, and she struggles with emotional eating.  Depression Screen [name]'s Food and Mood (modified PHQ-9) score was 22.   Subjective:   1. Other fatigue Marlissa admits to daytime somnolence and admits to waking up still tired. Patient has a history of symptoms of daytime fatigue, morning fatigue, and morning headache. Shiloe generally gets 2 or 4 hours of sleep per night, and states that she has nightime awakenings. Snoring is present. Apneic episodes are present. Epworth Sleepiness Score is 5.   2. SOB (shortness of breath) on exertion Maria Middleton notes increasing shortness of breath with exercising and seems to be worsening over time with weight gain. She notes getting out of breath sooner with activity than she used to. This has not gotten worse recently. Maria Middleton denies shortness of breath at rest or orthopnea.  3. Decreased  GFR Most recent available GFR was 50 in 2021.  She probably has blood work outside of network.  She has a history of renal cancer status post partial nephrectomy, hypertension and prediabetes and is also affected by obesity all of these conditions may increase the risk of chronic kidney disease.  4. Primary hypertension Blood pressure at goal for age and risk category.  On losartan without adverse effects.  Most recent renal parameters reviewed showed a decrease in GFR which I suspect may be related to history of partial nephrectomy so she does have chronic kidney disease.  5. Prediabetes Her most recent A1c was 6.0.  Patient is unaware of diagnosis.  We are checking a fasting blood sugar, hemoglobin A1c and insulin levels today.  Patient will be counseled on condition as well as risk of progression.  She will work on reducing simple and added sugars in her diet.   Assessment/Plan:   1. Other fatigue Maria Middleton does feel that her weight is causing her energy to be lower than it should be. Fatigue may be related to obesity, depression or many other causes. Labs will be ordered, and in the meanwhile, Maria Middleton will focus on self care including making healthy food choices, increasing physical activity and focusing on stress reduction.  - EKG 12-Lead - Vitamin B12 - CBC with Differential/Platelet  2. SOB (shortness of breath) on exertion Maria Middleton does feel that she gets out of breath more easily that she used to when she exercises. Maria Middleton's shortness of breath appears to be obesity related and exercise induced. She has agreed to work on weight loss and gradually increase exercise to treat her exercise  induced shortness of breath. Will continue to monitor closely.  3. Decreased GFR We will check renal parameters today.  Her blood pressure is relatively well-controlled for age.  She avoids nephrotoxins.  Patient counseled on maintaining adequate hydration.  - Comprehensive metabolic panel  4. Primary  hypertension We will check renal parameters today for staging.  She will continue current regimen.  Losing 10% of body weight may improve condition.  5. Prediabetes Losing 10% of her body weight may improve condition.  Also increasing physical activity to 150 minutes a week.  She may also be a candidate for pharmacoprophylaxis with metformin or incretin therapy.  - Hemoglobin A1c - Insulin, random - Lipid Panel With LDL/HDL Ratio  6. Depression screen Maria Middleton had a positive depression screening. Depression is commonly associated with obesity and often results in emotional eating behaviors. We will monitor this closely and work on CBT to help improve the non-hunger eating patterns. Referral to Psychology may be required if no improvement is seen as she continues in our clinic.  7. Class 1 obesity with serious comorbidity and body mass index (BMI) of 30.0 to 30.9 in adult, unspecified obesity type - TSH - VITAMIN D 25 Hydroxy (Vit-D Deficiency, Fractures)  Maria Middleton is currently in the action stage of change and her goal is to continue with weight loss efforts. I recommend [name] begin the structured treatment plan as follows:  She has agreed to the Category 1 Plan.  Exercise goals: All adults should avoid inactivity. Some physical activity is better than none, and adults who participate in any amount of physical activity gain some health benefits.   Behavioral modification strategies: increasing lean protein intake, decreasing simple carbohydrates, increasing water intake, decreasing liquid calories, increasing high fiber foods, decreasing eating out, no skipping meals, meal planning and cooking strategies, keeping healthy foods in the home, better snacking choices, avoiding temptations, and planning for success.  She was informed of the importance of frequent follow-up visits to maximize her success with intensive lifestyle modifications for her multiple health conditions. She was informed we  would discuss her lab results at her next visit unless there is a critical issue that needs to be addressed sooner. [name] agreed to keep her next visit at the agreed upon time to discuss these results.  Objective:   Blood pressure 137/83, pulse 62, temperature 97.9 F (36.6 C), height 5\' 3"  (1.6 m), weight 171 lb (77.6 kg), SpO2 97 %. Body mass index is 30.29 kg/m.  EKG: Normal sinus rhythm, rate (unable to obtain).  Indirect Calorimeter completed today shows a VO2 of 166 and a REE of 1138.  Her calculated basal metabolic rate is 1610 thus her basal metabolic rate is worse than expected.  General: Cooperative, alert, well developed, in no acute distress. HEENT: Conjunctivae and lids unremarkable. Cardiovascular: Regular rhythm.  Lungs: Normal work of breathing. Neurologic: No focal deficits.   Lab Results  Component Value Date   CREATININE 1.11 (H) 05/14/2020   BUN 12 05/14/2020   NA 139 05/14/2020   K 4.0 05/14/2020   CL 104 05/14/2020   CO2 24 05/14/2020   Lab Results  Component Value Date   ALT 11 (L) 08/16/2017   AST 21 08/16/2017   ALKPHOS 65 08/16/2017   BILITOT 0.6 08/16/2017   Lab Results  Component Value Date   HGBA1C 6.0 (H) 02/27/2020   No results found for: "INSULIN" No results found for: "TSH" Lab Results  Component Value Date   CHOL 191 02/27/2020  HDL 68 02/27/2020   LDLCALC 111 (H) 02/27/2020   TRIG 62 02/27/2020   CHOLHDL 2.8 02/27/2020   Lab Results  Component Value Date   WBC 7.0 08/26/2017   HGB 11.7 (L) 08/26/2017   HCT 34.5 (L) 08/26/2017   MCV 92.2 08/26/2017   PLT 166 08/26/2017   No results found for: "IRON", "TIBC", "FERRITIN"  Attestation Statements:   Reviewed by clinician on day of visit: allergies, medications, problem list, medical history, surgical history, family history, social history, and previous encounter notes.  Time spent on visit including pre-visit chart review and post-visit charting and care was 40 minutes.    Trude Mcburney, am acting as transcriptionist for Worthy Rancher, MD.  I have reviewed the above documentation for accuracy and completeness, and I agree with the above. -Worthy Rancher, MD

## 2022-11-04 NOTE — Assessment & Plan Note (Signed)
Her most recent A1c was 6.0.  Patient is unaware of diagnosis.  We are checking a fasting blood sugar, hemoglobin A1c and insulin levels today.  Patient will be counseled on condition as well as risk of progression.  She will work on reducing simple and added sugars in her diet.  Losing 10% of her body weight may improve condition.  Also increasing physical activity to 150 minutes a week.  She may also be a candidate for pharmacoprophylaxis with metformin or incretin therapy.

## 2022-11-04 NOTE — Assessment & Plan Note (Signed)
Blood pressure at goal for age and risk category.  On losartan without adverse effects.  Most recent renal parameters reviewed showed a decrease in GFR which I suspect may be related to history of partial nephrectomy so she does have chronic kidney disease.  We will check renal parameters today for staging.  She will continue current regimen.  Losing 10% of body weight may improve condition

## 2022-11-05 LAB — LIPID PANEL WITH LDL/HDL RATIO
Cholesterol, Total: 210 mg/dL — ABNORMAL HIGH (ref 100–199)
HDL: 64 mg/dL (ref >39)
LDL Chol Calc (NIH): 129 mg/dL — ABNORMAL HIGH (ref 0–99)
LDL/HDL Ratio: 2 ratio (ref 0.0–3.2)
Triglycerides: 95 mg/dL (ref 0–149)
VLDL Cholesterol Cal: 17 mg/dL (ref 5–40)

## 2022-11-05 LAB — COMPREHENSIVE METABOLIC PANEL
ALT: 11 IU/L (ref 0–32)
AST: 20 IU/L (ref 0–40)
Albumin/Globulin Ratio: 1.6 (ref 1.2–2.2)
Albumin: 4.1 g/dL (ref 3.8–4.8)
Alkaline Phosphatase: 90 IU/L (ref 44–121)
BUN/Creatinine Ratio: 11 — ABNORMAL LOW (ref 12–28)
BUN: 12 mg/dL (ref 8–27)
Bilirubin Total: 0.5 mg/dL (ref 0.0–1.2)
CO2: 27 mmol/L (ref 20–29)
Calcium: 9.6 mg/dL (ref 8.7–10.3)
Chloride: 99 mmol/L (ref 96–106)
Creatinine, Ser: 1.06 mg/dL — ABNORMAL HIGH (ref 0.57–1.00)
Globulin, Total: 2.5 g/dL (ref 1.5–4.5)
Glucose: 87 mg/dL (ref 70–99)
Potassium: 4.1 mmol/L (ref 3.5–5.2)
Sodium: 139 mmol/L (ref 134–144)
Total Protein: 6.6 g/dL (ref 6.0–8.5)
eGFR: 55 mL/min/{1.73_m2} — ABNORMAL LOW (ref >59)

## 2022-11-05 LAB — CBC WITH DIFFERENTIAL/PLATELET
Basophils Absolute: 0 10*3/uL (ref 0.0–0.2)
Basos: 1 %
EOS (ABSOLUTE): 0.1 10*3/uL (ref 0.0–0.4)
Eos: 2 %
Hematocrit: 45.5 % (ref 34.0–46.6)
Hemoglobin: 15.3 g/dL (ref 11.1–15.9)
Immature Grans (Abs): 0 10*3/uL (ref 0.0–0.1)
Immature Granulocytes: 0 %
Lymphocytes Absolute: 1.3 10*3/uL (ref 0.7–3.1)
Lymphs: 25 %
MCH: 30.7 pg (ref 26.6–33.0)
MCHC: 33.6 g/dL (ref 31.5–35.7)
MCV: 91 fL (ref 79–97)
Monocytes Absolute: 0.4 10*3/uL (ref 0.1–0.9)
Monocytes: 8 %
Neutrophils Absolute: 3.4 10*3/uL (ref 1.4–7.0)
Neutrophils: 64 %
Platelets: 215 10*3/uL (ref 150–450)
RBC: 4.98 x10E6/uL (ref 3.77–5.28)
RDW: 13.2 % (ref 11.7–15.4)
WBC: 5.3 10*3/uL (ref 3.4–10.8)

## 2022-11-05 LAB — TSH: TSH: 1.88 u[IU]/mL (ref 0.450–4.500)

## 2022-11-05 LAB — INSULIN, RANDOM: INSULIN: 6.2 u[IU]/mL (ref 2.6–24.9)

## 2022-11-05 LAB — VITAMIN B12: Vitamin B-12: 470 pg/mL (ref 232–1245)

## 2022-11-05 LAB — VITAMIN D 25 HYDROXY (VIT D DEFICIENCY, FRACTURES): Vit D, 25-Hydroxy: 55.4 ng/mL (ref 30.0–100.0)

## 2022-11-05 LAB — HEMOGLOBIN A1C
Est. average glucose Bld gHb Est-mCnc: 123 mg/dL
Hgb A1c MFr Bld: 5.9 % — ABNORMAL HIGH (ref 4.8–5.6)

## 2022-11-05 NOTE — Telephone Encounter (Signed)
Pt has not returned phone call, will await further communication from pt.

## 2022-11-06 NOTE — Telephone Encounter (Signed)
Pt called back and wanted her [place] results, discussed results with her and she is aware. Pt states she took the nexium bid for 2-3 days but states it made her feel bad. She wants to know should she just go ahead and start on the Pepcid now? Please advise.

## 2022-11-18 ENCOUNTER — Ambulatory Visit (INDEPENDENT_AMBULATORY_CARE_PROVIDER_SITE_OTHER): Payer: PPO | Admitting: Internal Medicine

## 2022-11-19 ENCOUNTER — Encounter (INDEPENDENT_AMBULATORY_CARE_PROVIDER_SITE_OTHER): Payer: Self-pay | Admitting: Family Medicine

## 2022-11-19 ENCOUNTER — Ambulatory Visit (INDEPENDENT_AMBULATORY_CARE_PROVIDER_SITE_OTHER): Payer: PPO | Admitting: Family Medicine

## 2022-11-19 VITALS — BP 150/89 | HR 54 | Temp 97.4°F | Ht 63.0 in | Wt 176.0 lb

## 2022-11-19 DIAGNOSIS — Z6830 Body mass index (BMI) 30.0-30.9, adult: Secondary | ICD-10-CM | POA: Insufficient documentation

## 2022-11-19 DIAGNOSIS — E669 Obesity, unspecified: Secondary | ICD-10-CM

## 2022-11-19 DIAGNOSIS — I10 Essential (primary) hypertension: Secondary | ICD-10-CM | POA: Diagnosis not present

## 2022-11-19 DIAGNOSIS — Z91018 Allergy to other foods: Secondary | ICD-10-CM

## 2022-11-19 DIAGNOSIS — R7303 Prediabetes: Secondary | ICD-10-CM | POA: Diagnosis not present

## 2022-11-19 DIAGNOSIS — Z6831 Body mass index (BMI) 31.0-31.9, adult: Secondary | ICD-10-CM | POA: Diagnosis not present

## 2022-11-20 ENCOUNTER — Telehealth: Payer: Self-pay | Admitting: Physician Assistant

## 2022-11-20 NOTE — Telephone Encounter (Signed)
PT is having a hard time drinking water and wants to discuss if this could be due to a hiatal hernia. She also wants to know what she can do for the bad breath associated with gerd. Please advise.

## 2022-11-20 NOTE — Telephone Encounter (Signed)
Line rings no answer no voice mail  °

## 2022-11-24 NOTE — Telephone Encounter (Signed)
Left message for pt to call back  °

## 2022-11-24 NOTE — Progress Notes (Signed)
Chief Complaint:   OBESITY Maria Middleton is here to discuss her progress with her obesity treatment plan along with follow-up of her obesity related diagnoses. Maria Middleton is on the Category 1 Plan and states she is following her eating plan approximately 60% of the time. Maria Middleton states she is walking 3-4 times per week.   Today's visit was #: 2 Starting weight: 171 lbs Starting date: 11/04/2022 Today's weight: 176 lbs Today's date: 11/19/2022 Total lbs lost to date: 0 Total lbs lost since last in-office visit: 0  Interim History: Patient struggled to follow her plan closely.  She had some dental work done and this affected her appetite.  She cannot eat lactose and she is sensitive to gluten, and she wonders if she is sensitive.  Subjective:   1. Food allergy Patient suspects she has some food allergies but she has not been tested.  2. Prediabetes Patient has an elevated A1c and notes significant polyphagia which makes weight loss difficult.  3. Primary hypertension Patient's blood pressure is elevated today.  She missed her morning dose.  She denies chest pain or headache.  Assessment/Plan:   1. Food allergy We will refer the patient to allergy/immunology.  Patient was educated on IgE versus IgG testing and we will follow-up after results.  - Ambulatory referral to Allergy  2. Prediabetes Patient agreed to start food journaling and work on decreasing simple carbohydrates.  3. Primary hypertension Patient will continue her current medications, and will work on her diet and weight loss to improve her blood pressure.  4. BMI 31.0-31.9,adult  5. Obesity, Beginning BMI 30.29 Maria Middleton is currently in the action stage of change. As such, her goal is to continue with weight loss efforts. She has agreed to change to keeping a food journal and adhering to recommended goals of 1000-1200 calories and 70+ grams of protein daily.   Exercise goals: As is.   Behavioral modification  strategies: keeping a strict food journal.  Maria Middleton has agreed to follow-up with our clinic in 2 weeks. She was informed of the importance of frequent follow-up visits to maximize her success with intensive lifestyle modifications for her multiple health conditions.   Objective:   Blood pressure (!) 150/89, pulse (!) 54, temperature (!) 97.4 F (36.3 C), height 5\' 3"  (1.6 m), weight 176 lb (79.8 kg), SpO2 97 %. Body mass index is 31.18 kg/m.  Lab Results  Component Value Date   CREATININE 1.06 (H) 11/04/2022   BUN 12 11/04/2022   NA 139 11/04/2022   K 4.1 11/04/2022   CL 99 11/04/2022   CO2 27 11/04/2022   Lab Results  Component Value Date   ALT 11 11/04/2022   AST 20 11/04/2022   ALKPHOS 90 11/04/2022   BILITOT 0.5 11/04/2022   Lab Results  Component Value Date   HGBA1C 5.9 (H) 11/04/2022   HGBA1C 6.0 (H) 02/27/2020   Lab Results  Component Value Date   INSULIN 6.2 11/04/2022   Lab Results  Component Value Date   TSH 1.880 11/04/2022   Lab Results  Component Value Date   CHOL 210 (H) 11/04/2022   HDL 64 11/04/2022   LDLCALC 129 (H) 11/04/2022   TRIG 95 11/04/2022   CHOLHDL 2.8 02/27/2020   Lab Results  Component Value Date   VD25OH 55.4 11/04/2022   Lab Results  Component Value Date   WBC 5.3 11/04/2022   HGB 15.3 11/04/2022   HCT 45.5 11/04/2022   MCV 91 11/04/2022   PLT  215 11/04/2022   No results found for: "IRON", "TIBC", "FERRITIN"  Attestation Statements:   Reviewed by clinician on day of visit: allergies, medications, problem list, medical history, surgical history, family history, social history, and previous encounter notes.  Time spent on visit including pre-visit chart review and post-visit care and charting was 45 minutes.   I, Burt Knack, am acting as transcriptionist for Quillian Quince, MD.  I have reviewed the above documentation for accuracy and completeness, and I agree with the above. -  Quillian Quince, MD

## 2022-11-25 NOTE — Telephone Encounter (Signed)
Left message for pt to call back  °

## 2022-11-26 NOTE — Telephone Encounter (Signed)
No return call from patient to date. Will await return call for further recommendations.

## 2022-12-03 ENCOUNTER — Encounter (INDEPENDENT_AMBULATORY_CARE_PROVIDER_SITE_OTHER): Payer: Self-pay | Admitting: Family Medicine

## 2022-12-03 ENCOUNTER — Ambulatory Visit (INDEPENDENT_AMBULATORY_CARE_PROVIDER_SITE_OTHER): Payer: PPO | Admitting: Family Medicine

## 2022-12-03 VITALS — BP 125/84 | HR 55 | Temp 97.6°F | Ht 63.0 in | Wt 173.0 lb

## 2022-12-03 DIAGNOSIS — Z6830 Body mass index (BMI) 30.0-30.9, adult: Secondary | ICD-10-CM | POA: Diagnosis not present

## 2022-12-03 DIAGNOSIS — F3289 Other specified depressive episodes: Secondary | ICD-10-CM

## 2022-12-03 DIAGNOSIS — E7800 Pure hypercholesterolemia, unspecified: Secondary | ICD-10-CM | POA: Diagnosis not present

## 2022-12-03 DIAGNOSIS — E669 Obesity, unspecified: Secondary | ICD-10-CM

## 2022-12-03 DIAGNOSIS — G4709 Other insomnia: Secondary | ICD-10-CM | POA: Diagnosis not present

## 2022-12-03 MED ORDER — ESCITALOPRAM OXALATE 10 MG PO TABS
10.0000 mg | ORAL_TABLET | Freq: Every day | ORAL | 0 refills | Status: DC
Start: 2022-12-03 — End: 2023-01-14

## 2022-12-03 NOTE — Progress Notes (Signed)
[facility]  Office: [phone]  /  Fax: [phone]  WEIGHT SUMMARY AND BIOMETRICS  Anthropometric Measurements Height: 5\' 3"  (1.6 m) Weight: 173 lb (78.5 kg) BMI (Calculated): 30.65 Weight at Last Visit: 176 lb Weight Lost Since Last Visit: 3 lb Weight Gained Since Last Visit: 0 Starting Weight: 171 lb   Body Composition  Body Fat %: 43.2 % Fat Mass (lbs): 75 lbs Muscle Mass (lbs): 93.6 lbs Total Body Water (lbs): 67.8 lbs Visceral Fat Rating : 13   Other Clinical Data Fasting: No Labs: No Today's Visit #: 3 Starting Date: 11/04/22  Discussed the use of AI scribe software for clinical note transcription with the patient, who gave verbal consent to proceed.  Chief Complaint: OBESITY  Maria Middleton is here to discuss her progress with her obesity treatment plan. She is on the keeping a food journal and adhering to recommended goals of 1200 calories and 75 protein and states she is following her eating plan approximately 50 % of the time. She states she is exercising 0 minutes 0 times per week.   History of Present Illness   The patient, with a history of obesity and depression, presents with concerns about self-sabotaging behaviors related to dietary habits. They report a pattern of planning healthy meals, only to experience sudden cravings leading to the consumption of unhealthy foods, such as donuts. The patient has never experienced this behavior before and finds it distressing.  The patient is currently on Lexapro, which was prescribed in the late summer or early fall of the previous year. They express dissatisfaction with the medication, feeling it is not as effective as it was during two previous periods of use. The patient also reports issues with sleep, including difficulty falling asleep and frequent awakenings during the night. They recently started using a new mattress, which has somewhat improved their sleep quality.  Despite these challenges, the patient has made some  positive dietary changes. They have started consuming more protein, including [place] sausage, egg whites, and almond butter. They also report enjoying Fairlife milk, which they consume with a small amount of honey. However, they express concern about the increased dairy intake, as they were previously dairy-free and now feel bloated and "cloudy" or "foggy-headed."  The patient also reports a recent three-pound weight loss, which they attribute to these dietary changes. However, they express a need for additional support, feeling that they are struggling with the emotional aspects of their weight and dietary changes. They express frustration and a sense of unfairness about their current health challenges, particularly as they were not overweight in their younger years.          PHYSICAL EXAM:  Blood pressure 125/84, pulse (!) 55, temperature 97.6 F (36.4 C), height 5\' 3"  (1.6 m), weight 173 lb (78.5 kg), SpO2 97 %. Body mass index is 30.65 kg/m.  DIAGNOSTIC DATA REVIEWED:  BMET    Component Value Date/Time   NA 139 11/04/2022 0923   K 4.1 11/04/2022 0923   CL 99 11/04/2022 0923   CO2 27 11/04/2022 0923   GLUCOSE 87 11/04/2022 0923   GLUCOSE 91 08/26/2017 0348   BUN 12 11/04/2022 0923   CREATININE 1.06 (H) 11/04/2022 0923   CALCIUM 9.6 11/04/2022 0923   GFRNONAA 50 (L) 05/14/2020 1105   GFRAA 58 (L) 05/14/2020 1105   Lab Results  Component Value Date   HGBA1C 5.9 (H) 11/04/2022   HGBA1C 6.0 (H) 02/27/2020   Lab Results  Component Value Date   INSULIN 6.2 11/04/2022  Lab Results  Component Value Date   TSH 1.880 11/04/2022   CBC    Component Value Date/Time   WBC 5.3 11/04/2022 0923   WBC 7.0 08/26/2017 0348   RBC 4.98 11/04/2022 0923   RBC 3.74 (L) 08/26/2017 0348   HGB 15.3 11/04/2022 0923   HCT 45.5 11/04/2022 0923   PLT 215 11/04/2022 0923   MCV 91 11/04/2022 0923   MCH 30.7 11/04/2022 0923   MCH 31.3 08/26/2017 0348   MCHC 33.6 11/04/2022 0923   MCHC 33.9  08/26/2017 0348   RDW 13.2 11/04/2022 0923   Iron Studies No results found for: "IRON", "TIBC", "FERRITIN", "IRONPCTSAT" Lipid Panel     Component Value Date/Time   CHOL 210 (H) 11/04/2022 0923   TRIG 95 11/04/2022 0923   HDL 64 11/04/2022 0923   CHOLHDL 2.8 02/27/2020 0926   LDLCALC 129 (H) 11/04/2022 0923   Hepatic Function Panel     Component Value Date/Time   PROT 6.6 11/04/2022 0923   ALBUMIN 4.1 11/04/2022 0923   AST 20 11/04/2022 0923   ALT 11 11/04/2022 0923   ALKPHOS 90 11/04/2022 0923   BILITOT 0.5 11/04/2022 0923      Component Value Date/Time   TSH 1.880 11/04/2022 0923   Nutritional Lab Results  Component Value Date   VD25OH 55.4 11/04/2022     Assessment and Plan    Obesity: Patient reports difficulty with self-control and emotional eating, leading to consumption of unhealthy foods. Patient has made some dietary changes, focusing on protein intake, but struggles with consistency. Patient also reports feeling "cloudy" and "foggy-headed," which may be related to medication or sleep issues. -Increase Lexapro dose to 10mg  daily and consider taking in the morning to potentially improve sleep and energy levels. -Encourage continued focus on protein intake and mindful eating. -Refer to bariatric psychologist for support with emotional eating and behavior change. -Continue journaling to track food intake and emotions related to eating.  Insomnia: Patient reports difficulty sleeping and inconsistent use of sleep apnea machine. Patient is currently on Lexapro, which may be contributing to sleep issues. -Consider adjusting timing of Lexapro to morning to potentially improve sleep. -Encourage consistent use of sleep apnea machine.  Hypercholesterolemia: Patient has concerns about dairy intake potentially increasing cholesterol levels. Patient has made some dietary changes, including consuming low-fat dairy products. -Continue low-fat dairy products and monitor  cholesterol levels. -Encourage patient to give body time to adjust to dietary changes.         She was informed of the importance of frequent follow up visits to maximize her success with intensive lifestyle modifications for her multiple health conditions. No follow-ups on file.   Quillian Quince, MD

## 2022-12-03 NOTE — Addendum Note (Signed)
Addended by: Theola Sequin on: 12/03/2022 03:47 PM   Modules accepted: Orders

## 2022-12-03 NOTE — Addendum Note (Signed)
Addended by: Quillian Quince D on: 12/03/2022 03:49 PM   Modules accepted: Orders

## 2022-12-04 NOTE — Telephone Encounter (Signed)
Pt reports she is being seen at the wt loss center and she is losing wt slowly. She states that now she is really having to force water down, states before she could drink lots of water and she wonders what is causing this now. Pt also wants to know if Marchelle Folks has any recommendations for something that may help with her terrible bad breath she feels is caused by Genella Rife. Please advise. Pt states if she is not home it is fine to leave a message on her machine at home.

## 2022-12-04 NOTE — Telephone Encounter (Signed)
Patient called back to speak with a nurse regarding the same issues below seeking advise.

## 2022-12-07 NOTE — Telephone Encounter (Signed)
Pt aware of results and recommendations per Quentin Mulling pa.

## 2022-12-08 NOTE — Progress Notes (Deleted)
12/08/2022 JAZZIE SYX [id number] January 20, 1951  Referring provider: Hortencia Conradi, NP Primary GI doctor: Dr. Chales Abrahams ( [facility])  ASSESSMENT AND PLAN:   Irritable bowel syndrome with constipation, Pelvic floor dysfunction Can consider anal manometry.  Add fiber to miralax, continue magnesium Continue pelvic floor PT, has helped Colon unremarkable 2017, recall 2027  Gastric and duodenal ulcers s/p EGD 07/2022 showing H pylori negative gastric ulcers Moderate Hiatal hernia Has been on nexium since EGD Still some postprandial epigastric pain with nausea, worse after fatty foods Has had some weight gain, added back on tumeric - will get RUQ [place] to rule out GB  - Increase nexium to twice a day for 6-8 weeks, then go back to once a day and add on pepcid at night - follow up 2 months - may need repeat EGD at that time to evaluate for healing.  - can still consider GES   Right RCC S/p partial nephrectomy Recent negative staging CT, will get records from [facility]  Morbid obesity  There is no height or weight on file to calculate BMI.  -Patient has been advised to make an attempt to improve diet and exercise patterns to aid in weight loss- will refer to weight loss clinic -Recommended diet heavy in fruits and veggies and low in animal meats, cheeses, and dairy products, appropriate calorie intake   Patient Care Team: Hortencia Conradi, NP as PCP - General Lyn Records, MD (Inactive) as PCP - Cardiology (Cardiology)  HISTORY OF PRESENT ILLNESS: 72 y.o. female with a past medical history of hypertension, depression, GERD, constipation, diverticulosis, status post hysterectomy, right renal cell carcinoma 2018 s/p left partial nephrectomy with Dr. Marlou Porch and others listed below presents for follow up, [facility].  08/20/2015 colonoscopy no polyps removed recall 10 years. She has had constipation and IBS-C, can not tolerate  linzess.  She would take magnesium and trifelo an herbel blend at night but this did not work well so she will take stimulant.  She continues to follow with Dr. Marlou Porch, she had recent CT with and without in Sept there.  07/2022 EGD with Dr. Chales Abrahams for GERD/nausea showed moderate hiatal, non bleeding gastric ulcers, duodenal erosions without bleeding. Negative H pylori gastric, negative celiac.  10/13/2022 OV with myself continue nextium twice daily for 8 weeks, then once a day, states had CT AB and pelvis with [facility], need records 10/29/22 RUQ US unremarkable Consider repeat EGD for healing, GES Patient was referred to weight loss center, states she has been having worsening nausea/vomiting since last seen.   She started back on tumeric, she is not on NSAIDS.  She states she has done well with her diet. Better if she avoids fatty foods or meats.  She is on nexium once daily.  She states recently she ate fried chicken at her church and it caused worsening epigastric, LLQ pain and nausea.  Can be better if she eats small frequent meals.  She continues to have some esophageal/chest discomfort.  She states she has gained weight and has been more lethargic.  She has OSA, on CPAP, still waking up tired.  Bm's have been a little more regular, daily. She has done pelvic floor exercises that helped.  She is still on magnesium daily.  Brother died within the last 2 months and cousin died, has had increased stressed.  No NSAIDS, no ETOH, no drug use.    She  reports that she has  never smoked. She has never used smokeless tobacco. She reports current alcohol use. She reports that she does not use drugs.  Current Medications:    Current Outpatient Medications (Cardiovascular):    hydrochlorothiazide (HYDRODIURIL) 25 MG tablet, Take 25 mg by mouth daily.   losartan (COZAAR) 50 MG tablet, Take 50 mg by mouth 2 (two) times daily.  Current Outpatient Medications (Respiratory):     fexofenadine (ALLEGRA) 180 MG tablet, Take 180 mg by mouth daily.   Current Outpatient Medications (Hematological):    Cyanocobalamin (B-12) 50 MCG TABS, Take by mouth.  Current Outpatient Medications (Other):    Ascorbic Acid (VITAMIN C) POWD, Take 1 Dose by mouth daily as needed (stress).   escitalopram (LEXAPRO) 10 MG tablet, Take 1 tablet (10 mg total) by mouth daily.   esomeprazole (NEXIUM) 40 MG packet, Take 40 mg by mouth daily before breakfast. Take 1 tablet 30 mins before breakfast   famotidine (PEPCID) 40 MG tablet, Take 1 tablet (40 mg total) by mouth at bedtime as needed for heartburn or indigestion. Can start after you finish the nexium twice a day (Patient not taking: Reported on 11/19/2022)   LORazepam (ATIVAN) 0.5 MG tablet, Take 0.5 mg by mouth at bedtime as needed for sleep.   MAGNESIUM PO, Take 1 tablet by mouth daily. With vitamin d   milk thistle 175 MG tablet, Take 175 mg by mouth daily.   NON FORMULARY, Take 2 capsules by mouth as needed (for IBS ( triphala)).   OVER THE COUNTER MEDICATION, Take 1 capsule by mouth at bedtime as needed (constipation). Cleanse More herbal laxative   Probiotic Product (PROBIOTIC BLEND PO), Take by mouth.   Turmeric (QC TUMERIC COMPLEX PO), Take by mouth.   zaleplon (SONATA) 10 MG capsule, Take 10 mg by mouth at bedtime.  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    BMI 30.0-30.9,adult    Chest pain    Constipation    Cytomegaloviral disease (HCC)    Depression    Diarrhea    Dyslipidemia    Dysrhythmia    Hx LBBB   Edema of both lower extremities    Epstein Barr virus infection    Fibromyalgia    Headache    migraines   Hiatal hernia    Hormonal disorder    Hypertension    Hypertensive heart disease without congestive heart failure     Hypothyroidism, unspecified    Insomnia    Joint pain    Left shoulder pain    Lower abdominal pain    Malaise and fatigue    Nausea & vomiting    Neck pain    Night terrors    Nightmare disorder    Osteoporosis    Otogenic pain    Pain in shoulder region, right    Palpitations    Panic attack    Pneumonia    4 years ago   Renal cell carcinoma of left kidney (HCC)    Renal mass 08/25/2017   Scoliosis    Sinus problem    Sleep apnea    SOB (shortness of breath)    SOB (shortness of breath)    Stomach pain    Stomach ulcer    Vaginal discharge    Vitamin  D deficiency    Allergies:  Allergies  Allergen Reactions   Penicillin G Swelling   Prolia [Denosumab] Other (See Comments)    Patient states she fainted   Cheese     Congestion    Milk-Related Compounds     Congestion    Penicillins Nausea Only    Has patient had a PCN reaction causing immediate rash, facial/tongue/throat swelling, SOB or lightheadedness with hypotension: no Has patient had a PCN reaction causing severe rash involving mucus membranes or skin necrosis: no Has patient had a PCN reaction that required hospitalization: no Has patient had a PCN reaction occurring within the last 10 years: no If all of the above answers are "NO", then may proceed with Cephalosporin use.    Sulfa Antibiotics Hives   Sulfamethoxazole Rash     Surgical History:  She  has a past surgical history that includes Abdominal hysterectomy; scharnoma; Tubal ligation; Dilation and curettage of uterus; Ankle surgery; and Robotic assited partial nephrectomy (Left, 08/25/2017). Family History:  Her family history includes Alcoholism in her mother; Diabetes in her mother; Emphysema in her brother; Heart disease in her father and mother; Hypertension in her father and mother; Leukemia in her brother; Stroke in her father and mother.  REVIEW OF SYSTEMS  : All other systems reviewed and negative except where noted in the History of Present  Illness.  PHYSICAL EXAM: There were no vitals taken for this visit. General:   Pleasant, well developed female in no acute distress Head:   Normocephalic and atraumatic. Eyes:  sclerae anicteric,conjunctive pink  Heart:   regular rate and rhythm Pulm:  Clear anteriorly; no wheezing Abdomen:   Soft, Obese AB, Active bowel sounds. mild tenderness in the epigastrium and in the LLQ. Without guarding and Without rebound, No organomegaly appreciated. Rectal: Not evaluated Extremities:  Without edema. Msk: Symmetrical without gross deformities. Peripheral pulses intact.  Neurologic:  Alert and  oriented x4;  No focal deficits.  Skin:   Dry and intact without significant lesions or rashes. Psychiatric:  Cooperative. Normal mood and affect.  RELEVANT LABS AND IMAGING: CBC    Component Value Date/Time   WBC 5.3 11/04/2022 0923   WBC 7.0 08/26/2017 0348   RBC 4.98 11/04/2022 0923   RBC 3.74 (L) 08/26/2017 0348   HGB 15.3 11/04/2022 0923   HCT 45.5 11/04/2022 0923   PLT 215 11/04/2022 0923   MCV 91 11/04/2022 0923   MCH 30.7 11/04/2022 0923   MCH 31.3 08/26/2017 0348   MCHC 33.6 11/04/2022 0923   MCHC 33.9 08/26/2017 0348   RDW 13.2 11/04/2022 0923   LYMPHSABS 1.3 11/04/2022 0923   EOSABS 0.1 11/04/2022 0923   BASOSABS 0.0 11/04/2022 0923    CMP     Component Value Date/Time   NA 139 11/04/2022 0923   K 4.1 11/04/2022 0923   CL 99 11/04/2022 0923   CO2 27 11/04/2022 0923   GLUCOSE 87 11/04/2022 0923   GLUCOSE 91 08/26/2017 0348   BUN 12 11/04/2022 0923   CREATININE 1.06 (H) 11/04/2022 0923   CALCIUM 9.6 11/04/2022 0923   PROT 6.6 11/04/2022 0923   ALBUMIN 4.1 11/04/2022 0923   AST 20 11/04/2022 0923   ALT 11 11/04/2022 0923   ALKPHOS 90 11/04/2022 0923   BILITOT 0.5 11/04/2022 0923   GFRNONAA 50 (L) 05/14/2020 1105   GFRAA 58 (L) 05/14/2020 1105     Doree Albee, PA-C 8:05 AM

## 2022-12-09 ENCOUNTER — Ambulatory Visit: Payer: PPO | Admitting: Physician Assistant

## 2022-12-14 NOTE — Progress Notes (Unsigned)
12/15/2022 Rhunette Croft [id number] 04-01-1951  Referring provider: Hortencia Conradi, NP Primary GI doctor: Dr. Chales Abrahams ( [facility])  ASSESSMENT AND PLAN:   Irritable bowel syndrome with constipation, Pelvic floor dysfunction Add fiber to miralax, continue magnesium Continue pelvic floor PT, has helped Colon unremarkable 2017, recall 2027  Gastric and duodenal ulcers s/p EGD 07/2022 showing H pylori negative gastric ulcers Moderate Hiatal hernia Has been on nexium since EGD Still some  nausea, worse after fatty foods and dairy - RUQ US unremarkable- still with AB pain worse with fatty foods- will get HIDA - cut out fatty foods/milk products - Has increase fullness, early satiety, check GES  Continue Nexium once daily, add Pepcid at night Will discuss with Dr. Chales Abrahams patient needs repeat endoscopic evaluation or not  Right RCC S/p partial nephrectomy Recent negative staging CT, will get records from [facility] Getting repeat in Sept  Morbid obesity  Body mass index is 31.53 kg/m.  - continue with weight loss   Patient Care Team: Hortencia Conradi, NP as PCP - General Lyn Records, MD (Inactive) as PCP - Cardiology (Cardiology)  HISTORY OF PRESENT ILLNESS: 72 y.o. female with a past medical history of hypertension, depression, GERD, constipation, diverticulosis, status post hysterectomy, right renal cell carcinoma 2018 s/p left partial nephrectomy with Dr. Marlou Porch and others listed below presents for follow up, [facility].  08/20/2015 colonoscopy no polyps removed recall 10 years. She has had constipation and IBS-C, can not tolerate linzess.  She would take magnesium and trifelo an herbel blend at night but this did not work well so she will take stimulant.  She continues to follow with Dr. Marlou Porch, she had recent CT with and without in Sept  2023 there.  07/2022 EGD with Dr. Chales Abrahams for GERD/nausea showed moderate  hiatal, non bleeding gastric ulcers, duodenal erosions without bleeding. Negative H pylori gastric, negative celiac.  10/13/2022 OV with myself continue nextium twice daily for 8 weeks, then once a day, states had CT AB and pelvis with [facility] in sept 2023, getting repeat 02/2023, need records 10/29/22 RUQ US unremarkable Consider repeat EGD for healing, GES Patient was referred to weight loss center, called and states she has been having worsening reflux with "hot water running into her throat, worse at".   She states when she started on the weight loss diet from the center and had increase in dairy with yogurt/cheese, she has stopped/cut back on this and states this has improved her symptoms.  She is still having a "fullness" in her chest with occ chest discomfort.  She states the nexium twice a day caused her to feel dizzy/unsteady so she has only been on once daily and she will take it about 30 mins before that.  She states worse in the evening.  She has a lot of bloating and gets full very quickly, if she eats too much she feel bad. She also has a lot of discomfort with fatty foods, will have severe Ab pain.  No fever, no chills.    Wt Readings from Last 6 Encounters:  12/15/22 178 lb (80.7 kg)  12/03/22 173 lb (78.5 kg)  11/19/22 176 lb (79.8 kg)  11/04/22 171 lb (77.6 kg)  10/29/22 171 lb (77.6 kg)  10/13/22 178 lb (80.7 kg)     She started back on tumeric, she is not on NSAIDS.  She states she has done well with her diet. Better if she  avoids fatty foods or meats.  She is on nexium once daily.  She states recently she ate fried chicken at her church and it caused worsening epigastric, LLQ pain and nausea.  Can be better if she eats small frequent meals.  She continues to have some esophageal/chest discomfort.  She states she has gained weight and has been more lethargic.  She has OSA, on CPAP, still waking up tired.  Bm's have been a little more regular, daily. She  has done pelvic floor exercises that helped.  She is still on magnesium daily.  Brother died within the last 2 months and cousin died, has had increased stressed.  No NSAIDS, no ETOH, no drug use.    She  reports that she has never smoked. She has never used smokeless tobacco. She reports current alcohol use. She reports that she does not use drugs.  Current Medications:    Current Outpatient Medications (Cardiovascular):    hydrochlorothiazide (HYDRODIURIL) 25 MG tablet, Take 25 mg by mouth daily.   losartan (COZAAR) 50 MG tablet, Take 50 mg by mouth 2 (two) times daily.  Current Outpatient Medications (Respiratory):    fexofenadine (ALLEGRA) 180 MG tablet, Take 180 mg by mouth daily.   Current Outpatient Medications (Hematological):    Cyanocobalamin (B-12) 50 MCG TABS, Take by mouth.  Current Outpatient Medications (Other):    Ascorbic Acid (VITAMIN C) POWD, Take 1 Dose by mouth daily as needed (stress).   escitalopram (LEXAPRO) 10 MG tablet, Take 1 tablet (10 mg total) by mouth daily.   esomeprazole (NEXIUM) 40 MG packet, Take 40 mg by mouth daily before breakfast. Take 1 tablet 30 mins before breakfast   famotidine (PEPCID) 40 MG tablet, Take 1 tablet (40 mg total) by mouth at bedtime as needed for heartburn or indigestion. Can start after you finish the nexium twice a day   LORazepam (ATIVAN) 0.5 MG tablet, Take 0.5 mg by mouth at bedtime as needed for sleep.   MAGNESIUM PO, Take 1 tablet by mouth daily. With vitamin d   milk thistle 175 MG tablet, Take 175 mg by mouth daily.   NON FORMULARY, Take 2 capsules by mouth as needed (for IBS ( triphala)).   OVER THE COUNTER MEDICATION, Take 1 capsule by mouth at bedtime as needed (constipation). Cleanse More herbal laxative   Probiotic Product (PROBIOTIC BLEND PO), Take by mouth.   Turmeric (QC TUMERIC COMPLEX PO), Take by mouth.   zaleplon (SONATA) 10 MG capsule, Take 10 mg by mouth at bedtime.  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    BMI 30.0-30.9,adult    Chest pain    Constipation    Cytomegaloviral disease (HCC)    Depression    Diarrhea    Dyslipidemia    Dysrhythmia    Hx LBBB   Edema of both lower extremities    Epstein Barr virus infection    Fibromyalgia    Headache    migraines   Hiatal hernia    Hormonal disorder    Hypertension    Hypertensive heart disease without congestive heart failure    Hypothyroidism, unspecified    Insomnia    Joint pain    Left shoulder pain    Lower abdominal pain  Malaise and fatigue    Nausea & vomiting    Neck pain    Night terrors    Nightmare disorder    Osteoporosis    Otogenic pain    Pain in shoulder region, right    Palpitations    Panic attack    Pneumonia    4 years ago   Renal cell carcinoma of left kidney (HCC)    Renal mass 08/25/2017   Scoliosis    Sinus problem    Sleep apnea    SOB (shortness of breath)    SOB (shortness of breath)    Stomach pain    Stomach ulcer    Vaginal discharge    Vitamin D deficiency    Allergies:  Allergies  Allergen Reactions   Penicillin G Swelling   Prolia [Denosumab] Other (See Comments)    Patient states she fainted   Cheese     Congestion    Milk-Related Compounds     Congestion    Penicillins Nausea Only    Has patient had a PCN reaction causing immediate rash, facial/tongue/throat swelling, SOB or lightheadedness with hypotension: no Has patient had a PCN reaction causing severe rash involving mucus membranes or skin necrosis: no Has patient had a PCN reaction that required hospitalization: no Has patient had a PCN reaction occurring within the last 10 years: no If all of the above answers are "NO", then may proceed with Cephalosporin use.    Sulfa Antibiotics Hives   Sulfamethoxazole  Rash     Surgical History:  She  has a past surgical history that includes Abdominal hysterectomy; scharnoma; Tubal ligation; Dilation and curettage of uterus; Ankle surgery; and Robotic assited partial nephrectomy (Left, 08/25/2017). Family History:  Her family history includes Alcoholism in her mother; Diabetes in her mother; Emphysema in her brother; Heart disease in her father and mother; Hypertension in her father and mother; Leukemia in her brother; Stroke in her father and mother.  REVIEW OF SYSTEMS  : All other systems reviewed and negative except where noted in the History of Present Illness.  PHYSICAL EXAM: Wt 178 lb (80.7 kg)   BMI 31.53 kg/m  General:   Pleasant, well developed female in no acute distress Head:   Normocephalic and atraumatic. Eyes:  sclerae anicteric,conjunctive pink  Heart:   regular rate and rhythm Pulm:  Clear anteriorly; no wheezing Abdomen:   Soft, Obese AB, Active bowel sounds. mild tenderness in the epigastrium and in the LLQ. Without guarding and Without rebound, No organomegaly appreciated. Rectal: Not evaluated Extremities:  Without edema. Msk: Symmetrical without gross deformities. Peripheral pulses intact.  Neurologic:  Alert and  oriented x4;  No focal deficits.  Skin:   Dry and intact without significant lesions or rashes. Psychiatric:  Cooperative. Normal mood and affect.  RELEVANT LABS AND IMAGING: CBC    Component Value Date/Time   WBC 5.3 11/04/2022 0923   WBC 7.0 08/26/2017 0348   RBC 4.98 11/04/2022 0923   RBC 3.74 (L) 08/26/2017 0348   HGB 15.3 11/04/2022 0923   HCT 45.5 11/04/2022 0923   PLT 215 11/04/2022 0923   MCV 91 11/04/2022 0923   MCH 30.7 11/04/2022 0923   MCH 31.3 08/26/2017 0348   MCHC 33.6 11/04/2022 0923   MCHC 33.9 08/26/2017 0348   RDW 13.2 11/04/2022 0923   LYMPHSABS 1.3 11/04/2022 0923   EOSABS 0.1 11/04/2022 0923   BASOSABS 0.0 11/04/2022 0923    CMP     Component Value Date/Time  NA 139 11/04/2022  0923   K 4.1 11/04/2022 0923   CL 99 11/04/2022 0923   CO2 27 11/04/2022 0923   GLUCOSE 87 11/04/2022 0923   GLUCOSE 91 08/26/2017 0348   BUN 12 11/04/2022 0923   CREATININE 1.06 (H) 11/04/2022 0923   CALCIUM 9.6 11/04/2022 0923   PROT 6.6 11/04/2022 0923   ALBUMIN 4.1 11/04/2022 0923   AST 20 11/04/2022 0923   ALT 11 11/04/2022 0923   ALKPHOS 90 11/04/2022 0923   BILITOT 0.5 11/04/2022 0923   GFRNONAA 50 (L) 05/14/2020 1105   GFRAA 58 (L) 05/14/2020 1105     Doree Albee, PA-C 12:40 PM

## 2022-12-15 ENCOUNTER — Ambulatory Visit (INDEPENDENT_AMBULATORY_CARE_PROVIDER_SITE_OTHER): Payer: PPO | Admitting: Physician Assistant

## 2022-12-15 ENCOUNTER — Encounter: Payer: Self-pay | Admitting: Physician Assistant

## 2022-12-15 VITALS — Wt 178.0 lb

## 2022-12-15 DIAGNOSIS — Z6830 Body mass index (BMI) 30.0-30.9, adult: Secondary | ICD-10-CM

## 2022-12-15 DIAGNOSIS — R1013 Epigastric pain: Secondary | ICD-10-CM | POA: Diagnosis not present

## 2022-12-15 DIAGNOSIS — R140 Abdominal distension (gaseous): Secondary | ICD-10-CM | POA: Diagnosis not present

## 2022-12-15 DIAGNOSIS — K581 Irritable bowel syndrome with constipation: Secondary | ICD-10-CM

## 2022-12-15 DIAGNOSIS — R110 Nausea: Secondary | ICD-10-CM

## 2022-12-15 DIAGNOSIS — Z85528 Personal history of other malignant neoplasm of kidney: Secondary | ICD-10-CM

## 2022-12-15 NOTE — Patient Instructions (Signed)
Please take your proton pump inhibitor medication, continue nexium once daily  Please take this medication 30 minutes to 1 hour before meals- this makes it more effective. continue pepcid at night for GERD I would do this for 6-8 weeks nightly, then go to AS needed as you have been doing Avoid spicy and acidic foods Avoid fatty foods Limit your intake of coffee, tea, alcohol, and carbonated drinks Work to maintain a healthy weight Keep the head of the bed elevated at least 3 inches with blocks or a wedge pillow if you are having any nighttime symptoms Stay upright for 2 hours after eating Avoid meals and snacks three to four hours before bedtime Avoid dairy likely have lactose intolerance.  Continue weight loss    You have been scheduled for a gastric emptying scan at [facility] on      at    . Please arrive at least 30 minutes prior to your appointment for registration. Please make certain not to have anything to eat or drink after midnight the night before your test. Hold all stomach medications (ex: Zofran, phenergan, Reglan) 24 hours prior to your test. If you need to reschedule your appointment, please contact radiology scheduling at [phone]. _____________________________________________________________________ A gastric-emptying study measures how long it takes for food to move through your stomach. There are several ways to measure stomach emptying. In the most common test, you eat food that contains a small amount of radioactive material. A scanner that detects the movement of the radioactive material is placed over your abdomen to monitor the rate at which food leaves your stomach. This test normally takes about 4 hours to complete. _____________________________________________________________________   Maria Middleton have been scheduled for a HIDA scan at [facility] (1st floor) on     . Please arrive 30 minutes prior to your scheduled appointment at      . Make certain  not to have anything to eat or drink at least 6 hours prior to your test. Should this appointment date or time not work well for you, please call radiology scheduling at [phone].  _____________________________________________________________________ hepatobiliary (HIDA) scan is an imaging procedure used to diagnose problems in the liver, gallbladder and bile ducts. In the HIDA scan, a radioactive chemical or tracer is injected into a vein in your arm. The tracer is handled by the liver like bile. Bile is a fluid produced and excreted by your liver that helps your digestive system break down fats in the foods you eat. Bile is stored in your gallbladder and the gallbladder releases the bile when you eat a meal. A special nuclear medicine scanner (gamma camera) tracks the flow of the tracer from your liver into your gallbladder and small intestine.  During your HIDA scan  You'll be asked to change into a hospital gown before your HIDA scan begins. Your health care team will position you on a table, usually on your back. The radioactive tracer is then injected into a vein in your arm.The tracer travels through your bloodstream to your liver, where it's taken up by the bile-producing cells. The radioactive tracer travels with the bile from your liver into your gallbladder and through your bile ducts to your small intestine.You may feel some pressure while the radioactive tracer is injected into your vein. As you lie on the table, a special gamma camera is positioned over your abdomen taking pictures of the tracer as it moves through your body. The gamma camera takes pictures continually for about an  hour. You'll need to keep still during the HIDA scan. This can become uncomfortable, but you may find that you can lessen the discomfort by taking deep breaths and thinking about other things. Tell your health care team if you're uncomfortable. The radiologist will watch on a computer the progress of the radioactive  tracer through your body. The HIDA scan may be stopped when the radioactive tracer is seen in the gallbladder and enters your small intestine. This typically takes about an hour. In some cases extra imaging will be performed if original images aren't satisfactory, if morphine is given to help visualize the gallbladder or if the medication CCK is given to look at the contraction of the gallbladder. This test typically takes 2 hours to complete. ________________________________________________________________________   We have scheduled you a follow up with Dr Chales Abrahams on 03/09/2023 11:20am      Gastroparesis  Please do small frequent meals like 4-6 meals a day.  Eat and drink liquids at separate times.  Avoid high fiber foods, cook your vegetables, avoid high fat food.  Suggest spreading protein throughout the day (greek yogurt, glucerna, soft meat, milk, eggs) Choose soft foods that you can mash with a fork When you are more symptomatic, change to pureed foods foods and liquids.  Consider reading "Living well with Gastroparesis" by Reuel Derby Gastroparesis is a condition in which food takes longer than normal to empty from the stomach. This condition is also known as delayed gastric emptying. It is usually a long-term (chronic) condition. There is no cure, but there are treatments and things that you can do at home to help relieve symptoms. Treating the underlying condition that causes gastroparesis can also help relieve symptoms What are the causes? In many cases, the cause of this condition is not known. Possible causes include: A hormone (endocrine) disorder, such as hypothyroidism or diabetes. A nervous system disease, such as Parkinson's disease or multiple sclerosis. Cancer, infection, or surgery that affects the stomach or vagus nerve. The vagus nerve runs from your chest, through your neck, and to the lower part of your brain. A connective tissue disorder, such as  scleroderma. Certain medicines. What increases the risk? You are more likely to develop this condition if: You have certain disorders or diseases. These may include: An endocrine disorder. An eating disorder. Amyloidosis. Scleroderma. Parkinson's disease. Multiple sclerosis. Cancer or infection of the stomach or the vagus nerve. You have had surgery on your stomach or vagus nerve. You take certain medicines. You are female. What are the signs or symptoms? Symptoms of this condition include: Feeling full after eating very little or a loss of appetite. Nausea, vomiting, or heartburn. Bloating of your abdomen. Inconsistent blood sugar (glucose) levels on blood tests. Unexplained weight loss. Acid from the stomach coming up into the esophagus (gastroesophageal reflux). Sudden tightening (spasm) of the stomach, which can be painful. Symptoms may come and go. Some people may not notice any symptoms. How is this diagnosed? This condition is diagnosed with tests, such as: Tests that check how long it takes food to move through the stomach and intestines. These tests include: Upper gastrointestinal (GI) series. For this test, you drink a liquid that shows up well on X-rays, and then X-rays are taken of your intestines. Gastric emptying scintigraphy. For this test, you eat food that contains a small amount of radioactive material, and then scans are taken. Wireless capsule GI monitoring system. For this test, you swallow a pill (capsule) that records information about how foods  and fluid move through your stomach. Gastric manometry. For this test, a tube is passed down your throat and into your stomach to measure electrical and muscular activity. Endoscopy. For this test, a long, thin tube with a camera and light on the end is passed down your throat and into your stomach to check for problems in your stomach lining. Ultrasound. This test uses sound waves to create images of the inside of your  body. This can help rule out gallbladder disease or pancreatitis as a cause of your symptoms. How is this treated? There is no cure for this condition, but treatment and home care may relieve symptoms. Treatment may include: Treating the underlying cause. Managing your symptoms by making changes to your diet and exercise habits. Taking medicines to control nausea and vomiting and to stimulate stomach muscles. Getting food through a feeding tube in the hospital. This may be done in severe cases. Having surgery to insert a device called a gastric electrical stimulator into your body. This device helps improve stomach emptying and control nausea and vomiting. Follow these instructions at home: Take over-the-counter and prescription medicines only as told by your health care provider. Follow instructions from your health care provider about eating or drinking restrictions. Your health care provider may recommend that you: Eat smaller meals more often. Eat low-fat foods. Eat low-fiber forms of high-fiber foods. For example, eat cooked vegetables instead of raw vegetables. Have only liquid foods instead of solid foods. Liquid foods are easier to digest. Drink enough fluid to keep your urine pale yellow. Exercise as often as told by your health care provider. Keep all follow-up visits. This is important. Contact a health care provider if you: Notice that your symptoms do not improve with treatment. Have new symptoms. Get help right away if you: Have severe pain in your abdomen that does not improve with treatment. Have nausea that is severe or does not go away. Vomit every time you drink fluids. Summary Gastroparesis is a long-term (chronic) condition in which food takes longer than normal to empty from the stomach. Symptoms include nausea, vomiting, heartburn, bloating of your abdomen, and loss of appetite. Eating smaller portions, low-fat foods, and low-fiber forms of high-fiber foods may help  you manage your symptoms. Get help right away if you have severe pain in your abdomen. This information is not intended to replace advice given to you by your health care provider. Make sure you discuss any questions you have with your health care provider. Document Revised: 10/23/2019 Document Reviewed: 10/23/2019 Elsevier Patient Education  2021 [employer].  _______________________________________________________  If your blood pressure at your visit was 140/90 or greater, please contact your primary care physician to follow up on this.  _______________________________________________________  If you are age 17 or older, your body mass index should be between 23-30. Your Body mass index is 31.53 kg/m. If this is out of the aforementioned range listed, please consider follow up with your Primary Care Provider.  If you are age 54 or younger, your body mass index should be between 19-25. Your Body mass index is 31.53 kg/m. If this is out of the aformentioned range listed, please consider follow up with your Primary Care Provider.   ________________________________________________________  [facility] providers would like to encourage you to use [facility] to communicate with providers for non-urgent requests or questions.  Due to long hold times on the telephone, sending your provider a message by [facility] may be a faster and more efficient way to  get a response.  Please allow 48 business hours for a response.  Please remember that this is for non-urgent requests.  _______________________________________________________ It was a pleasure to see you today!  Thank you for trusting me with your gastrointestinal care!

## 2022-12-16 ENCOUNTER — Ambulatory Visit (INDEPENDENT_AMBULATORY_CARE_PROVIDER_SITE_OTHER): Payer: PPO | Admitting: Internal Medicine

## 2022-12-16 ENCOUNTER — Encounter (INDEPENDENT_AMBULATORY_CARE_PROVIDER_SITE_OTHER): Payer: Self-pay | Admitting: Internal Medicine

## 2022-12-16 VITALS — BP 123/83 | HR 85 | Temp 97.7°F | Ht 63.0 in | Wt 175.0 lb

## 2022-12-16 DIAGNOSIS — Z6831 Body mass index (BMI) 31.0-31.9, adult: Secondary | ICD-10-CM

## 2022-12-16 DIAGNOSIS — E669 Obesity, unspecified: Secondary | ICD-10-CM

## 2022-12-16 DIAGNOSIS — R7303 Prediabetes: Secondary | ICD-10-CM | POA: Diagnosis not present

## 2022-12-16 DIAGNOSIS — R948 Abnormal results of function studies of other organs and systems: Secondary | ICD-10-CM | POA: Insufficient documentation

## 2022-12-16 NOTE — Assessment & Plan Note (Signed)
Patient has a BMR based on IC of about 1100 cal versus calculated of 1376 kcal.  This is likely secondary to chronic skipping of meals, muscle loss associated with menopause and low nutritional protein intake, low physical activity levels and genetics.  She is currently on an isocaloric diet.  We discussed the benefits of strengthening to preserve muscle mass and improve metabolic rate.

## 2022-12-16 NOTE — Progress Notes (Signed)
Office: [phone]  /  Fax: [phone]  WEIGHT SUMMARY AND BIOMETRICS  Vitals Temp: 97.7 F (36.5 C) BP: 123/83 Pulse Rate: 85 SpO2: 97 %   Anthropometric Measurements Height: 5\' 3"  (1.6 m) Weight: 175 lb (79.4 kg) BMI (Calculated): 31.01 Weight at Last Visit: 173 lb Weight Gained Since Last Visit: 2 lb Starting Weight: 171 lb Total Weight Loss (lbs): 3 lb (1.361 kg) Peak Weight: 179 lb   Body Composition  Body Fat %: 43.6 % Fat Mass (lbs): 76.6 lbs Muscle Mass (lbs): 97 lbs Total Body Water (lbs): 69.8 lbs Visceral Fat Rating : 13    No data recorded Today's Visit #: 4  Starting Date: 11/04/22   HPI  Chief Complaint: OBESITY  Maria Middleton is here to discuss her progress with her obesity treatment plan. She is on the the Category 1 Plan and states she is following her eating plan approximately 60 % of the time. She states she is exercising 30 minutes 3 times per week.  Interval History:  I have not seen Maria Middleton since her second appointment.  Since last office visit she has gained 2 pounds.  She is having problems with acid reflux and upset stomach which is exacerbated by increased dairy.  She had been seen by my colleague and was encouraged to begin tracking and journaling which she has not. She reports fair adherence to reduced calorie nutritional plan. She has been working on increasing protein intake at every meal, avoiding and or reducing liquid calories, and making healthier choices  Orixegenic Control: Denies problems with appetite and hunger signals.  Denies problems with satiety and satiation.  Denies problems with eating patterns and portion control.  Denies abnormal cravings. Denies feeling deprived or restricted.   Barriers identified: none.   Pharmacotherapy for weight loss: She is currently taking no anti-obesity medication.    ASSESSMENT AND PLAN  TREATMENT PLAN FOR OBESITY:  Recommended Dietary Goals  Maria Middleton is currently in  the action stage of change. As such, her goal is to continue weight management plan. She has agreed to: continue current plan  Behavioral Intervention  We discussed the following Behavioral Modification Strategies today: increasing lean protein intake, decreasing simple carbohydrates , increasing vegetables, increasing lower glycemic fruits, increasing water intake, work on meal planning and preparation, work on tracking and journaling calories using tracking application, continue to practice mindfulness when eating, planning for success, and patient was provided with a list of tracking apps and also a paper journal.  I also we reviewed her the basics of reading a food label. .  She may need to see a registered dietitian for more medically tailored approach due to issues with her digestive tract.  She is currently being evaluated for delayed gastric emptying.  I therefore recommend eating smaller and more frequent meals.  Additional resources provided today: None  Recommended Physical Activity Goals  Maria Middleton has been advised to work up to 150 minutes of moderate intensity aerobic activity a week and strengthening exercises 2-3 times per week for cardiovascular health, weight loss maintenance and preservation of muscle mass.   She has agreed to :  Think about ways to increase daily physical activity and overcoming barriers to exercise  Pharmacotherapy We discussed various medication options to help Maria Middleton with her weight loss efforts and we both agreed to : continue with nutritional and behavioral strategies  ASSOCIATED CONDITIONS ADDRESSED TODAY  Obesity, Beginning BMI 30.29  Prediabetes Assessment & Plan: Most recent A1c is  Lab Results  Component Value Date   HGBA1C 5.9 (H) 11/04/2022   HGBA1C 6.0 (H) 02/27/2020    Patient aware of disease state and risk of progression. This may contribute to abnormal cravings, fatigue and diabetic complications without having diabetes.   We  reviewed treatment options which includes losing 7 to 10% of body weight, increasing physical activity to a goal of 150 minutes a week at moderate intensity. She may also be a candidate for pharmacoprophylaxis with metformin or incretin mimetic.      Abnormal metabolism Assessment & Plan: Patient has a BMR based on IC of about 1100 cal versus calculated of 1376 kcal.  This is likely secondary to chronic skipping of meals, muscle loss associated with menopause and low nutritional protein intake, low physical activity levels and genetics.  She is currently on an isocaloric diet.  We discussed the benefits of strengthening to preserve muscle mass and improve metabolic rate.   Class 1 obesity with serious comorbidity and body mass index (BMI) of 31.0 to 31.9 in adult, unspecified obesity type    PHYSICAL EXAM:  Blood pressure 123/83, pulse 85, temperature 97.7 F (36.5 C), height 5\' 3"  (1.6 m), weight 175 lb (79.4 kg), SpO2 97 %. Body mass index is 31 kg/m.  General: She is overweight, cooperative, alert, well developed, and in no acute distress. PSYCH: Has normal mood, affect and thought process.   HEENT: EOMI, sclerae are anicteric. Lungs: Normal breathing effort, no conversational dyspnea. Extremities: No edema.  Neurologic: No gross sensory or motor deficits. No tremors or fasciculations noted.    DIAGNOSTIC DATA REVIEWED:  BMET    Component Value Date/Time   NA 139 11/04/2022 0923   K 4.1 11/04/2022 0923   CL 99 11/04/2022 0923   CO2 27 11/04/2022 0923   GLUCOSE 87 11/04/2022 0923   GLUCOSE 91 08/26/2017 0348   BUN 12 11/04/2022 0923   CREATININE 1.06 (H) 11/04/2022 0923   CALCIUM 9.6 11/04/2022 0923   GFRNONAA 50 (L) 05/14/2020 1105   GFRAA 58 (L) 05/14/2020 1105   Lab Results  Component Value Date   HGBA1C 5.9 (H) 11/04/2022   HGBA1C 6.0 (H) 02/27/2020   Lab Results  Component Value Date   INSULIN 6.2 11/04/2022   Lab Results  Component Value Date   TSH  1.880 11/04/2022   CBC    Component Value Date/Time   WBC 5.3 11/04/2022 0923   WBC 7.0 08/26/2017 0348   RBC 4.98 11/04/2022 0923   RBC 3.74 (L) 08/26/2017 0348   HGB 15.3 11/04/2022 0923   HCT 45.5 11/04/2022 0923   PLT 215 11/04/2022 0923   MCV 91 11/04/2022 0923   MCH 30.7 11/04/2022 0923   MCH 31.3 08/26/2017 0348   MCHC 33.6 11/04/2022 0923   MCHC 33.9 08/26/2017 0348   RDW 13.2 11/04/2022 0923   Iron Studies No results found for: "IRON", "TIBC", "FERRITIN", "IRONPCTSAT" Lipid Panel     Component Value Date/Time   CHOL 210 (H) 11/04/2022 0923   TRIG 95 11/04/2022 0923   HDL 64 11/04/2022 0923   CHOLHDL 2.8 02/27/2020 0926   LDLCALC 129 (H) 11/04/2022 0923   Hepatic Function Panel     Component Value Date/Time   PROT 6.6 11/04/2022 0923   ALBUMIN 4.1 11/04/2022 0923   AST 20 11/04/2022 0923   ALT 11 11/04/2022 0923   ALKPHOS 90 11/04/2022 0923   BILITOT 0.5 11/04/2022 0923      Component Value Date/Time   TSH 1.880 11/04/2022 1610  Nutritional Lab Results  Component Value Date   VD25OH 55.4 11/04/2022     Return in about 2 weeks (around 12/30/2022) for For Weight Mangement with Dr. Rikki Spearing.[name] She was informed of the importance of frequent follow up visits to maximize her success with intensive lifestyle modifications for her multiple health conditions.   ATTESTASTION STATEMENTS:  Reviewed by clinician on day of visit: allergies, medications, problem list, medical history, surgical history, family history, social history, and previous encounter notes.     Worthy Rancher, MD

## 2022-12-16 NOTE — Assessment & Plan Note (Signed)
Most recent A1c is  Lab Results  Component Value Date   HGBA1C 5.9 (H) 11/04/2022   HGBA1C 6.0 (H) 02/27/2020    Patient aware of disease state and risk of progression. This may contribute to abnormal cravings, fatigue and diabetic complications without having diabetes.   We reviewed treatment options which includes losing 7 to 10% of body weight, increasing physical activity to a goal of 150 minutes a week at moderate intensity. She may also be a candidate for pharmacoprophylaxis with metformin or incretin mimetic.

## 2022-12-30 ENCOUNTER — Ambulatory Visit (INDEPENDENT_AMBULATORY_CARE_PROVIDER_SITE_OTHER): Payer: PPO | Admitting: Family Medicine

## 2022-12-30 ENCOUNTER — Encounter (INDEPENDENT_AMBULATORY_CARE_PROVIDER_SITE_OTHER): Payer: Self-pay | Admitting: Family Medicine

## 2022-12-30 VITALS — BP 138/85 | HR 66 | Temp 97.5°F | Ht 63.0 in | Wt 174.0 lb

## 2022-12-30 DIAGNOSIS — E669 Obesity, unspecified: Secondary | ICD-10-CM

## 2022-12-30 DIAGNOSIS — Z6830 Body mass index (BMI) 30.0-30.9, adult: Secondary | ICD-10-CM | POA: Diagnosis not present

## 2022-12-30 DIAGNOSIS — I10 Essential (primary) hypertension: Secondary | ICD-10-CM

## 2022-12-30 NOTE — Progress Notes (Unsigned)
Chief Complaint:   OBESITY Maria Middleton is here to discuss her progress with her obesity treatment plan along with follow-up of her obesity related diagnoses. Maria Middleton is on the Category 1 Plan and states she is following her eating plan approximately 60% of the time. Maria Middleton states she is lifting weights and walking for 30 minutes 3 times per week.  Today's visit was #: 5 Starting weight: 171 lbs Starting date: 11/04/2022 Today's weight: 174 lbs Today's date: 12/30/2022 Total lbs lost to date: 0 Total lbs lost since last in-office visit: 1  Interim History: Patient has lost 1 pound since her last visit.  She was on vacation last week but she continues to be mindful.  She is working on trying to journal and she would like to continue.  Subjective:   1. Primary hypertension Patient's blood pressure is well-controlled on losartan and hydrochlorothiazide.  She denies chest pain or headache.  Assessment/Plan:   1. Primary hypertension Patient will continue to work on her diet, exercise, and weight loss.  2. BMI 30.0-30.9,adult  3. Obesity, Beginning BMI 30.29 [name] is currently in the action stage of change. As such, her goal is to continue with weight loss efforts. She has agreed to the Category 2 Plan or keeping a food journal and adhering to recommended goals of 1200 calories and 70+ grams of protein daily.   Breakfast options were discussed.   Exercise goals: As is.   Behavioral modification strategies: increasing lean protein intake.  Maria Middleton has agreed to follow-up with our clinic in 2 to 3 weeks. She was informed of the importance of frequent follow-up visits to maximize her success with intensive lifestyle modifications for her multiple health conditions.   Objective:   Blood pressure 138/85, pulse 66, temperature (!) 97.5 F (36.4 C), height 5\' 3"  (1.6 m), weight 174 lb (78.9 kg), SpO2 99 %. Body mass index is 30.82 kg/m.  Lab Results  Component Value Date    CREATININE 1.06 (H) 11/04/2022   BUN 12 11/04/2022   NA 139 11/04/2022   K 4.1 11/04/2022   CL 99 11/04/2022   CO2 27 11/04/2022   Lab Results  Component Value Date   ALT 11 11/04/2022   AST 20 11/04/2022   ALKPHOS 90 11/04/2022   BILITOT 0.5 11/04/2022   Lab Results  Component Value Date   HGBA1C 5.9 (H) 11/04/2022   HGBA1C 6.0 (H) 02/27/2020   Lab Results  Component Value Date   INSULIN 6.2 11/04/2022   Lab Results  Component Value Date   TSH 1.880 11/04/2022   Lab Results  Component Value Date   CHOL 210 (H) 11/04/2022   HDL 64 11/04/2022   LDLCALC 129 (H) 11/04/2022   TRIG 95 11/04/2022   CHOLHDL 2.8 02/27/2020   Lab Results  Component Value Date   VD25OH 55.4 11/04/2022   Lab Results  Component Value Date   WBC 5.3 11/04/2022   HGB 15.3 11/04/2022   HCT 45.5 11/04/2022   MCV 91 11/04/2022   PLT 215 11/04/2022   No results found for: "IRON", "TIBC", "FERRITIN"  Attestation Statements:   Reviewed by clinician on day of visit: allergies, medications, problem list, medical history, surgical history, family history, social history, and previous encounter notes.  Time spent on visit including pre-visit chart review and post-visit care and charting was 40 minutes.   I, Burt Knack, am acting as transcriptionist for Quillian Quince, MD.  I have reviewed the above documentation for accuracy and completeness,  and I agree with the above. -  Quillian Quince, MD

## 2023-01-05 ENCOUNTER — Telehealth (INDEPENDENT_AMBULATORY_CARE_PROVIDER_SITE_OTHER): Payer: PPO | Admitting: Psychology

## 2023-01-05 DIAGNOSIS — F32A Depression, unspecified: Secondary | ICD-10-CM | POA: Diagnosis not present

## 2023-01-05 DIAGNOSIS — F5089 Other specified eating disorder: Secondary | ICD-10-CM | POA: Diagnosis not present

## 2023-01-05 DIAGNOSIS — F419 Anxiety disorder, unspecified: Secondary | ICD-10-CM | POA: Diagnosis not present
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
## 2023-01-06 ENCOUNTER — Ambulatory Visit: Payer: PPO | Admitting: Allergy

## 2023-01-12 ENCOUNTER — Encounter (HOSPITAL_COMMUNITY)
Admission: RE | Admit: 2023-01-12 | Discharge: 2023-01-12 | Disposition: A | Discharge status: Home / Self Care | Payer: PPO | Source: Ambulatory Visit | Attending: Physician Assistant | Admitting: Physician Assistant

## 2023-01-12 DIAGNOSIS — R110 Nausea: Secondary | ICD-10-CM | POA: Insufficient documentation

## 2023-01-12 DIAGNOSIS — R1013 Epigastric pain: Secondary | ICD-10-CM | POA: Insufficient documentation

## 2023-01-12 DIAGNOSIS — R140 Abdominal distension (gaseous): Secondary | ICD-10-CM | POA: Insufficient documentation

## 2023-01-14 ENCOUNTER — Encounter (HOSPITAL_COMMUNITY)
Admission: RE | Admit: 2023-01-14 | Discharge: 2023-01-14 | Disposition: A | Discharge status: Home / Self Care | Payer: PPO | Source: Ambulatory Visit | Attending: Physician Assistant | Admitting: Physician Assistant

## 2023-01-14 ENCOUNTER — Encounter (INDEPENDENT_AMBULATORY_CARE_PROVIDER_SITE_OTHER): Payer: Self-pay | Admitting: Internal Medicine

## 2023-01-14 ENCOUNTER — Ambulatory Visit (INDEPENDENT_AMBULATORY_CARE_PROVIDER_SITE_OTHER): Payer: PPO | Admitting: Internal Medicine

## 2023-01-14 VITALS — BP 112/74 | HR 78 | Temp 97.7°F | Ht 63.0 in | Wt 172.0 lb

## 2023-01-14 DIAGNOSIS — E668 Other obesity: Secondary | ICD-10-CM

## 2023-01-14 DIAGNOSIS — F3289 Other specified depressive episodes: Secondary | ICD-10-CM

## 2023-01-14 DIAGNOSIS — R110 Nausea: Secondary | ICD-10-CM | POA: Diagnosis present

## 2023-01-14 DIAGNOSIS — R948 Abnormal results of function studies of other organs and systems: Secondary | ICD-10-CM | POA: Diagnosis not present

## 2023-01-14 DIAGNOSIS — R1013 Epigastric pain: Secondary | ICD-10-CM | POA: Insufficient documentation

## 2023-01-14 DIAGNOSIS — R140 Abdominal distension (gaseous): Secondary | ICD-10-CM | POA: Diagnosis present

## 2023-01-14 DIAGNOSIS — E669 Obesity, unspecified: Secondary | ICD-10-CM

## 2023-01-14 DIAGNOSIS — F5089 Other specified eating disorder: Secondary | ICD-10-CM

## 2023-01-14 DIAGNOSIS — Z6830 Body mass index (BMI) 30.0-30.9, adult: Secondary | ICD-10-CM

## 2023-01-14 MED ORDER — TECHNETIUM TC 99M SULFUR COLLOID: 2.0000 | Freq: Once | INTRAVENOUS | Status: DC | PRN

## 2023-01-14 MED ORDER — ESCITALOPRAM OXALATE 10 MG PO TABS
10.0000 mg | ORAL_TABLET | Freq: Every day | ORAL | 0 refills | Status: DC
Start: 2023-01-14 — End: 2023-02-16

## 2023-01-14 MED ORDER — TECHNETIUM TC 99M SULFUR COLLOID
2.0000 | Freq: Once | INTRAVENOUS | Status: AC | PRN
  Administered 2023-01-14: 2 via ORAL

## 2023-01-14 NOTE — Assessment & Plan Note (Signed)
Her IC is 1100 cal versus calculated of 1376 kcal.  This is likely secondary to chronic skipping of meals, muscle loss associated with menopause and low nutritional protein intake, low physical activity levels and genetics.  She is currently on an isocaloric diet.  We discussed ways to improve metabolism including strengthening exercises, increasing protein intake drinking adequate amount of water and sleep.

## 2023-01-14 NOTE — Progress Notes (Signed)
Office: [phone]  /  Fax: [phone]  WEIGHT SUMMARY AND BIOMETRICS  Vitals Temp: 97.7 F (36.5 C) BP: 112/74 Pulse Rate: 78 SpO2: 95 %   Anthropometric Measurements Height: 5\' 3"  (1.6 m) Weight: 172 lb (78 kg) BMI (Calculated): 30.48 Weight at Last Visit: 174 lb Weight Lost Since Last Visit: 2 lb Weight Gained Since Last Visit: 0 Starting Weight: 171 lb Total Weight Loss (lbs): 1 lb (0.454 kg) Peak Weight: 179 lb   Body Composition  Body Fat %: 41.6 % Fat Mass (lbs): 71.8 lbs Muscle Mass (lbs): 95.8 lbs Total Body Water (lbs): 65.4 lbs Visceral Fat Rating : 12    No data recorded Today's Visit #: 6  Starting Date: 11/04/22   HPI  Chief Complaint: OBESITY  Maria Middleton is here to discuss her progress with her obesity treatment plan. She is on the the Category 1 Plan and states she is following her eating plan approximately 70 % of the time. She states she is exercising, resistance training and walking 50 minutes 3 times per week.  Interval History:  Since last office visit she has lost 2 pounds.  She has stopped consuming dairy as she feels she is intolerant to milk products even those not containing lactose.  She recently underwent gastric emptying study and results are pending.  She inquires about nondairy sources of protein.  She has been seen by a behavioral health specialist and found consultation helpful she is working on reducing snacking.   Orixegenic Control: Denies problems with appetite and hunger signals.  Denies problems with satiety and satiation.  Denies problems with eating patterns and portion control.  Denies abnormal cravings. Denies feeling deprived or restricted.   Barriers identified:  Unspecified intolerance to certain foods .  Was referred by Dr. Dalbert Garnet to allergy specialist to assess for food allergy.  Pharmacotherapy for weight loss: She is currently taking no anti-obesity medication.    ASSESSMENT AND PLAN  TREATMENT PLAN  FOR OBESITY:  Recommended Dietary Goals  Maria Middleton is currently in the action stage of change. As such, her goal is to continue weight management plan. She has agreed to: continue current plan.  We counseled patient on nondairy sources of protein.   Behavioral Intervention  We discussed the following Behavioral Modification Strategies today: increasing lean protein intake and fibrous foods to improve satiety .  She was advised on eating smaller portions but to avoid skipping meals.  She has several food intolerances and I had a difficult time elucidating this today.  Additional resources provided today:  She was provided with recipes on quinoa and being salads to increase fiber and consumption of nutritious foods low on dairy.  Recommended Physical Activity Goals  Maria Middleton has been advised to work up to 150 minutes of moderate intensity aerobic activity a week and strengthening exercises 2-3 times per week for cardiovascular health, weight loss maintenance and preservation of muscle mass.   She has agreed to :  Think about ways to increase daily physical activity and overcoming barriers to exercise  Pharmacotherapy We discussed various medication options to help Maria Middleton with her weight loss efforts and we both agreed to : continue with nutritional and behavioral strategies  ASSOCIATED CONDITIONS ADDRESSED TODAY  Abnormal metabolism Assessment & Plan: Her IC is 1100 cal versus calculated of 1376 kcal.  This is likely secondary to chronic skipping of meals, muscle loss associated with menopause and low nutritional protein intake, low physical activity levels and genetics.  She is currently on an  isocaloric diet.  We discussed ways to improve metabolism including strengthening exercises, increasing protein intake drinking adequate amount of water and sleep.   Emotional Eating Behavior  Class 1 obesity with serious comorbidity and body mass index (BMI) of 31.0 to 31.9 in adult, unspecified  obesity type  Other Specified Feeding or Eating Disorder, Emotional Eating Behaviors Assessment & Plan: Patient seen by Dr. Dewaine Conger I have reviewed consultation.  She will continue to follow-up with provider for cognitive behavioral therapy.  Patient is working on reducing snacking and trying to increase her protein intake and more fruits and vegetables.  She is currently on SSRI started by Dr. Dalbert Garnet and will continue medication.  Medication refilled today.  Orders: -     Escitalopram Oxalate; Take 1 tablet (10 mg total) by mouth daily.  Dispense: 30 tablet; Refill: 0    PHYSICAL EXAM:  Blood pressure 112/74, pulse 78, temperature 97.7 F (36.5 C), height 5\' 3"  (1.6 m), weight 172 lb (78 kg), SpO2 95%. Body mass index is 30.47 kg/m.  General: She is overweight, cooperative, alert, well developed, and in no acute distress. PSYCH: Has normal mood, affect and thought process.   HEENT: EOMI, sclerae are anicteric. Lungs: Normal breathing effort, no conversational dyspnea. Extremities: No edema.  Neurologic: No gross sensory or motor deficits. No tremors or fasciculations noted.    DIAGNOSTIC DATA REVIEWED:  BMET    Component Value Date/Time   NA 139 11/04/2022 0923   K 4.1 11/04/2022 0923   CL 99 11/04/2022 0923   CO2 27 11/04/2022 0923   GLUCOSE 87 11/04/2022 0923   GLUCOSE 91 08/26/2017 0348   BUN 12 11/04/2022 0923   CREATININE 1.06 (H) 11/04/2022 0923   CALCIUM 9.6 11/04/2022 0923   GFRNONAA 50 (L) 05/14/2020 1105   GFRAA 58 (L) 05/14/2020 1105   Lab Results  Component Value Date   HGBA1C 5.9 (H) 11/04/2022   HGBA1C 6.0 (H) 02/27/2020   Lab Results  Component Value Date   INSULIN 6.2 11/04/2022   Lab Results  Component Value Date   TSH 1.880 11/04/2022   CBC    Component Value Date/Time   WBC 5.3 11/04/2022 0923   WBC 7.0 08/26/2017 0348   RBC 4.98 11/04/2022 0923   RBC 3.74 (L) 08/26/2017 0348   HGB 15.3 11/04/2022 0923   HCT 45.5 11/04/2022 0923    PLT 215 11/04/2022 0923   MCV 91 11/04/2022 0923   MCH 30.7 11/04/2022 0923   MCH 31.3 08/26/2017 0348   MCHC 33.6 11/04/2022 0923   MCHC 33.9 08/26/2017 0348   RDW 13.2 11/04/2022 0923   Iron Studies No results found for: "IRON", "TIBC", "FERRITIN", "IRONPCTSAT" Lipid Panel     Component Value Date/Time   CHOL 210 (H) 11/04/2022 0923   TRIG 95 11/04/2022 0923   HDL 64 11/04/2022 0923   CHOLHDL 2.8 02/27/2020 0926   LDLCALC 129 (H) 11/04/2022 0923   Hepatic Function Panel     Component Value Date/Time   PROT 6.6 11/04/2022 0923   ALBUMIN 4.1 11/04/2022 0923   AST 20 11/04/2022 0923   ALT 11 11/04/2022 0923   ALKPHOS 90 11/04/2022 0923   BILITOT 0.5 11/04/2022 0923      Component Value Date/Time   TSH 1.880 11/04/2022 0923   Nutritional Lab Results  Component Value Date   VD25OH 55.4 11/04/2022     No follow-ups on file.[name] She was informed of the importance of frequent follow up visits to maximize her success  with intensive lifestyle modifications for her multiple health conditions.   ATTESTASTION STATEMENTS:  Reviewed by clinician on day of visit: allergies, medications, problem list, medical history, surgical history, family history, social history, and previous encounter notes.     Worthy Rancher, MD

## 2023-01-14 NOTE — Assessment & Plan Note (Signed)
Patient seen by Dr. Dewaine Conger I have reviewed consultation.  She will continue to follow-up with provider for cognitive behavioral therapy.  Patient is working on reducing snacking and trying to increase her protein intake and more fruits and vegetables.  She is currently on SSRI started by Dr. Dalbert Garnet and will continue medication.  Medication refilled today.

## 2023-01-28 ENCOUNTER — Ambulatory Visit (INDEPENDENT_AMBULATORY_CARE_PROVIDER_SITE_OTHER): Payer: PPO | Admitting: Adult Health

## 2023-01-28 ENCOUNTER — Ambulatory Visit (INDEPENDENT_AMBULATORY_CARE_PROVIDER_SITE_OTHER): Payer: PPO | Admitting: Family Medicine

## 2023-02-08 ENCOUNTER — Telehealth (INDEPENDENT_AMBULATORY_CARE_PROVIDER_SITE_OTHER): Payer: PPO | Admitting: Psychology

## 2023-02-08 DIAGNOSIS — F32A Depression, unspecified: Secondary | ICD-10-CM | POA: Diagnosis not present

## 2023-02-08 DIAGNOSIS — F419 Anxiety disorder, unspecified: Secondary | ICD-10-CM | POA: Diagnosis not present

## 2023-02-08 DIAGNOSIS — F5089 Other specified eating disorder: Secondary | ICD-10-CM

## 2023-02-08 NOTE — Progress Notes (Signed)
  Office: [phone]  /  Fax: [phone]    Date: February 08, 2023  Appointment Start Time: 11:34am Duration: 27 minutes Provider: Lawerance Cruel, Psy.D. Type of Session: Individual Therapy  Location of Patient: Home (private location) Location of Provider: Provider's Home (private office) Type of Contact: Telepsychological Visit via MyChart Video Visit  Session Content: Maria Middleton is a 72 y.o. female presenting for a follow-up appointment to address the previously established treatment goal of increasing coping skills.Today's appointment was a telepsychological visit. Maria Middleton provided verbal consent for today's telepsychological appointment and she is aware she is responsible for securing confidentiality on her end of the session. Prior to proceeding with today's appointment, Maria Middleton's physical location at the time of this appointment was obtained as well a phone number she could be reached at in the event of technical difficulties. Maria Middleton and this provider participated in today's telepsychological service.   This provider conducted a brief check-in. Maria Middleton shared she was diagnosed with COVID-19 12 days ago, adding she "lost her appetite" resulting in weight loss. Reviewed emotional and physical hunger. Psychoeducation regarding triggers for emotional eating was provided. Maria Middleton was provided a handout, and encouraged to utilize the handout between now and the next appointment to increase awareness of triggers and frequency. Maria Middleton agreed. This provider also discussed behavioral strategies for specific triggers, such as placing the utensil down when conversing to avoid mindless eating. Maria Middleton provided verbal consent during today's appointment for this provider to send a handout about triggers via e-mail. Overall, Maria Middleton was receptive to today's appointment as evidenced by openness to sharing, responsiveness to feedback, and willingness to explore triggers for emotional eating.  Mental Status  Examination:  Appearance: neat Behavior: appropriate to circumstances Mood: neutral Affect: mood congruent Speech: WNL Eye Contact: appropriate Psychomotor Activity: WNL Gait: unable to assess Thought Process: linear, logical, and goal directed and no evidence or endorsement of suicidal, homicidal, and self-harm ideation, plan and intent  Thought Content/Perception: no hallucinations, delusions, bizarre thinking or behavior endorsed or observed Orientation: AAOx4 Memory/Concentration: memory, attention, language, and fund of knowledge intact  Insight: fair Judgment: fair  Interventions:  Conducted a brief chart review Provided empathic reflections and validation Reviewed content from the previous session Provided positive reinforcement Employed supportive psychotherapy interventions to facilitate reduced distress and to improve coping skills with identified stressors Psychoeducation provided regarding triggers for emotional eating behaviors  DSM-5 Diagnosis(es):  F50.89 Other Specified Feeding or Eating Disorder, Emotional Eating Behaviors, F32.A Unspecified Depressive Disorder, and F41.9 Unspecified Anxiety Disorder  Treatment Goal & Progress: During the initial appointment with this provider, the following treatment goal was established: increase coping skills. Progress is limited, as [name] has just begun treatment with this provider; however, she is receptive to the interaction and interventions and rapport is being established.   Plan: The next appointment is scheduled for 03/08/2023 at 2:30pm, which will be via MyChart Video Visit. The next session will focus on working towards the established treatment goal.

## 2023-02-16 ENCOUNTER — Other Ambulatory Visit: Payer: Self-pay

## 2023-02-16 ENCOUNTER — Ambulatory Visit: Payer: PPO | Admitting: Allergy & Immunology

## 2023-02-16 ENCOUNTER — Encounter: Payer: Self-pay | Admitting: Allergy & Immunology

## 2023-02-16 VITALS — BP 170/108 | HR 62 | Temp 97.9°F | Ht 63.0 in | Wt 176.8 lb

## 2023-02-16 DIAGNOSIS — K9049 Malabsorption due to intolerance, not elsewhere classified: Secondary | ICD-10-CM

## 2023-02-16 DIAGNOSIS — J302 Other seasonal allergic rhinitis: Secondary | ICD-10-CM

## 2023-02-16 DIAGNOSIS — J3089 Other allergic rhinitis: Secondary | ICD-10-CM

## 2023-02-16 MED ORDER — FEXOFENADINE HCL 180 MG PO TABS: 180.0000 mg | ORAL_TABLET | Freq: Every day | ORAL | 5 refills | Status: AC

## 2023-02-16 NOTE — Patient Instructions (Addendum)
1. Chronic rhinitis - Testing today showed: grasses, weeds, trees, indoor molds, outdoor molds, dust mites, cat, dog, and tobacco - Copy of test results provided.  - Avoidance measures provided. - Stop taking:  - Continue with: Allegra (fexofenadine) 180mg  tablet once daily - Start taking: Astelin (azelastine) 2 sprays per nostril 1-2 times daily as needed ON BAD DAYS - You can use an extra dose of the antihistamine, if needed, for breakthrough symptoms.  - Consider nasal saline rinses 1-2 times daily to remove allergens from the nasal cavities as well as help with mucous clearance (this is especially helpful to do before the nasal sprays are given) - Consider allergy shots as a means of long-term control. - Allergy shots "re-train" and "reset" the immune system to ignore environmental allergens and decrease the resulting immune response to those allergens (sneezing, itchy watery eyes, runny nose, nasal congestion, etc).    - Allergy shots improve symptoms in 75-85% of patients.  - We can discuss more at the next appointment if the medications are not working for you.  2. Food intolerance - Testing to the most common foods was negative. - There is a the low positive predictive value of food allergy testing and hence the high possibility of false positives. - In contrast, food allergy testing has a high negative predictive value, therefore if testing is negative we can be relatively assured that they are indeed negative.  - This rules out anaphylactic reactions, but you still might have intolerances, so avoid any foods that you think might be triggering your symptoms. - There is no need for an EpiPen at this point in time.   3. Return in about 3 months (around 05/19/2023).    Please inform us of any Emergency Department visits, hospitalizations, or changes in symptoms. Call us before going to the ED for breathing or allergy symptoms since we might be able to fit you in for a sick visit. Feel  free to contact us anytime with any questions, problems, or concerns.  It was a pleasure to meet you today!  Websites that have reliable patient information: 1. American Academy of Asthma, Allergy, and Immunology: www.aaaai.org 2. Food Allergy Research and Education (FARE): foodallergy.org 3. Mothers of Asthmatics: http://www.asthmacommunitynetwork.org 4. American College of Allergy, Asthma, and Immunology: www.acaai.org   COVID-19 Vaccine Information can be found at: [email or web address] For questions related to vaccine distribution or appointments, please email [email or web address] or call [phone].   We realize that you might be concerned about having an allergic reaction to the COVID19 vaccines. To help with that concern, WE ARE OFFERING THE COVID19 VACCINES IN OUR OFFICE! Ask the front desk for dates!     "Like" [place] on Facebook and Instagram for our latest updates!      A healthy democracy works best when [employer] participate! Make sure you are registered to vote! If you have moved or changed any of your contact information, you will need to get this updated before voting! Scan the QR codes below to learn more!         Airborne Adult Perc - 02/16/23 1047     Time Antigen Placed 1030    Allergen Manufacturer Waynette Buttery    Location Back    Number of Test 55    1. Control-Buffer 50% Glycerol Negative    2. Control-Histamine 2+    3. Bahia Negative    4. French Southern Territories Negative    5. Johnson Negative    6. Kentucky Blue 2+    7.  Meadow Fescue 2+    8. Perennial Rye 2+    9. Timothy Negative    10. Ragweed Mix Negative    11. Cocklebur 2+    12. Plantain,  English 2+    13. Baccharis 2+    14. Dog Fennel 2+    15. Gu[place] Thistle 2+    16. Lamb's Quarters 2+    17. Sheep Sorrell Negative    18. Rough Pigweed 2+    19. Marsh Elder, Rough Negative    20. Mugwort, Common Negative    21. Box, Elder 3+    22. Cedar,  red 2+    23. Sweet Gum Negative    24. Pecan Pollen 2+    25. Pine Mix 2+    26. Walnut, Black Pollen 3+    27. Red Mulberry 3+    28. Ash Mix 2+    29. Birch Mix Negative    30. Beech American Negative    31. Cottonwood, Guinea[place]iss[place] Negative    32. Hickory, White Negative    33. Maple Mix Negative    34. Oak, [place] Mix Negative    35. Sycamore Eastern Negative    36. Alternaria Alternata 3+    37. Cladosporium Herbarum 3+    38. Aspergillus Mix Negative    39. Penicillium Mix Negative    40. Bipolaris Sorokiniana (Helminthosporium) Negative    41. Drechslera Spicifera (Curvularia) 2+    42. Mucor Plumbeus Negative    43. Fusarium Moniliforme Negative    44. Aureobasidium Pullulans (pullulara) Negative    45. Rhizopus Oryzae Negative    46. Botrytis Cinera Negative    47. Epicoccum Nigrum 3+    48. Phoma Betae 2+    49. Dust Mite Mix 3+    50. Cat Hair 10,000 BAU/ml 3+    51.  Dog Epithelia 2+    52. Mixed Feathers Negative    53. Horse Epithelia Negative    54. Cockroach, German Negative    55. Tobacco Leaf 3+             Intradermal - 02/16/23 1048     Time Antigen Placed 1100    Allergen Manufacturer Greer    Location Arm    Number of Test 8    Control Negative    Bahia Negative    French Southern Territories Negative    Johnson Negative    Ragweed Mix Negative    Mold 2 Negative    Mold 4 Negative    Cockroach Negative             Food Adult Perc - 02/16/23 1000     Time Antigen Placed 1030    Allergen Manufacturer Greer    Location Back    Number of allergen test 17    1. Peanut Negative    2. Soybean Negative    3. Wheat Negative    4. Sesame Negative    5. Milk, Cow Negative    6. Casein Negative    7. Egg White, Chicken Negative    8. Shellfish Mix Negative    9. Fish Mix Negative    10. Cashew Negative    11. Walnut Food Negative    12. Almond Negative    13. Hazelnut Negative    14. Pecan Food Negative    15. Pistachio Negative    16. [place] Nut  Negative    17. Coconut Negative  Reducing Pollen Exposure  The American Academy of Allergy, Asthma and Immunology suggests the following steps to reduce your exposure to pollen during allergy seasons.    Do not hang sheets or clothing out to dry; pollen may collect on these items. Do not mow lawns or spend time around freshly cut grass; mowing stirs up pollen. Keep windows closed at night.  Keep car windows closed while driving. Minimize morning activities outdoors, a time when pollen counts are usually at their highest. Stay indoors as much as possible when pollen counts or humidity is high and on windy days when pollen tends to remain in the air longer. Use air conditioning when possible.  Many air conditioners have filters that trap the pollen spores. Use a HEPA room air filter to remove pollen form the indoor air you breathe.  Control of Mold Allergen   Mold and fungi can grow on a variety of surfaces provided certain temperature and moisture conditions exist.  Outdoor molds grow on plants, decaying vegetation and soil.  The major outdoor mold, Alternaria and Cladosporium, are found in very high numbers during hot and dry conditions.  Generally, a late Summer - Fall peak is seen for common outdoor fungal spores.  Rain will temporarily lower outdoor mold spore count, but counts rise rapidly when the rainy period ends.  The most important indoor molds are Aspergillus and Penicillium.  Dark, humid and poorly ventilated basements are ideal sites for mold growth.  The next most common sites of mold growth are the bathroom and the kitchen.  Outdoor (Seasonal) Mold Control  Positive outdoor molds via skin testing: Alternaria, Cladosporium, Drechslera (Curvalaria), and Epicoccum  Use air conditioning and keep windows closed Avoid exposure to decaying vegetation. Avoid leaf raking. Avoid grain handling. Consider wearing a face mask if working in moldy areas.    Indoor  (Perennial) Mold Control   Positive indoor molds via skin testing: Phoma  Maintain humidity below 50%. Clean washable surfaces with 5% bleach solution. Remove sources e.g. contaminated carpets.    Control of Dog or Cat Allergen  Avoidance is the best way to manage a dog or cat allergy. If you have a dog or cat and are allergic to dog or cats, consider removing the dog or cat from the home. If you have a dog or cat but don't want to find it a new home, or if your family wants a pet even though someone in the household is allergic, here are some strategies that may help keep symptoms at bay:  Keep the pet out of your bedroom and restrict it to only a few rooms. Be advised that keeping the dog or cat in only one room will not limit the allergens to that room. Don't pet, hug or kiss the dog or cat; if you do, wash your hands with soap and water. High-efficiency particulate air (HEPA) cleaners run continuously in a bedroom or living room can reduce allergen levels over time. Regular use of a high-efficiency vacuum cleaner or a central vacuum can reduce allergen levels. Giving your dog or cat a bath at least once a week can reduce airborne allergen.  Control of Dust Mite Allergen    Dust mites play a major role in allergic asthma and rhinitis.  They occur in environments with high humidity wherever human skin is found.  Dust mites absorb humidity from the atmosphere (ie, they do not drink) and feed on organic matter (including shed human and animal skin).  Dust mites are a  microscopic type of insect that you cannot see with the naked eye.  High levels of dust mites have been detected from mattresses, pillows, carpets, upholstered furniture, bed covers, clothes, soft toys and any woven material.  The principal allergen of the dust mite is found in its feces.  A gram of dust may contain 1,000 mites and 250,000 fecal particles.  Mite antigen is easily measured in the air during house cleaning  activities.  Dust mites do not bite and do not cause harm to humans, other than by triggering allergies/asthma.    Ways to decrease your exposure to dust mites in your home:  Encase mattresses, box springs and pillows with a mite-impermeable barrier or cover   Wash sheets, blankets and drapes weekly in hot water (130 F) with detergent and dry them in a dryer on the hot setting.  Have the room cleaned frequently with a vacuum cleaner and a damp dust-mop.  For carpeting or rugs, vacuuming with a vacuum cleaner equipped with a high-efficiency particulate air (HEPA) filter.  The dust mite allergic individual should not be in a room which is being cleaned and should wait 1 hour after cleaning before going into the room. Do not sleep on upholstered furniture (eg, couches).   If possible removing carpeting, upholstered furniture and drapery from the home is ideal.  Horizontal blinds should be eliminated in the rooms where the person spends the most time (bedroom, study, television room).  Washable vinyl, roller-type shades are optimal. Remove all non-washable stuffed toys from the bedroom.  Wash stuffed toys weekly like sheets and blankets above.   Reduce indoor humidity to less than 50%.  Inexpensive humidity monitors can be purchased at most hardware stores.  Do not use a humidifier as can make the problem worse and are not recommended.  Allergy Shots  Allergies are the result of a chain reaction that starts in the immune system. Your immune system controls how your body defends itself. For instance, if you have an allergy to pollen, your immune system identifies pollen as an invader or allergen. Your immune system overreacts by producing antibodies called Immunoglobulin E (IgE). These antibodies travel to cells that release chemicals, causing an allergic reaction.  The concept behind allergy immunotherapy, whether it is received in the form of shots or tablets, is that the immune system can be  desensitized to specific allergens that trigger allergy symptoms. Although it requires time and patience, the payback can be long-term relief. Allergy injections contain a dilute solution of those substances that you are allergic to based upon your skin testing and allergy history.   How Do Allergy Shots Work?  Allergy shots work much like a vaccine. Your body responds to injected amounts of a particular allergen given in increasing doses, eventually developing a resistance and tolerance to it. Allergy shots can lead to decreased, minimal or no allergy symptoms.  There generally are two phases: build-up and maintenance. Build-up often ranges from three to six months and involves receiving injections with increasing amounts of the allergens. The shots are typically given once or twice a week, though more rapid build-up schedules are sometimes used.  The maintenance phase begins when the most effective dose is reached. This dose is different for each person, depending on how allergic you are and your response to the build-up injections. Once the maintenance dose is reached, there are longer periods between injections, typically two to four weeks.  Occasionally doctors give cortisone-type shots that can temporarily reduce allergy symptoms.  These types of shots are different and should not be confused with allergy immunotherapy shots.  Who Can Be Treated with Allergy Shots?  Allergy shots may be a good treatment approach for people with allergic rhinitis (hay fever), allergic asthma, conjunctivitis (eye allergy) or stinging insect allergy.   Before deciding to begin allergy shots, you should consider:   The length of allergy season and the severity of your symptoms  Whether medications and/or changes to your environment can control your symptoms  Your desire to avoid long-term medication use  Time: allergy immunotherapy requires a major time commitment  Cost: may vary depending on your insurance  coverage  Allergy shots for children age 63 and older are effective and often well tolerated. They might prevent the onset of new allergen sensitivities or the progression to asthma.  Allergy shots are not started on patients who are pregnant but can be continued on patients who become pregnant while receiving them. In some patients with other medical conditions or who take certain common medications, allergy shots may be of risk. It is important to mention other medications you talk to your allergist.   What are the two types of build-ups offered:   RUSH or Rapid Desensitization -- one day of injections lasting from 8:30-4:30pm, injections every 1 hour.  Approximately half of the build-up process is completed in that one day.  The following week, normal build-up is resumed, and this entails ~16 visits either weekly or twice weekly, until reaching your "maintenance dose" which is continued weekly until eventually getting spaced out to every month for a duration of 3 to 5 years. The regular build-up appointments are nurse visits where the injections are administered, followed by required monitoring for 30 minutes.    Traditional build-up -- weekly visits for 6 -12 months until reaching "maintenance dose", then continue weekly until eventually spacing out to every 4 weeks as above. At these appointments, the injections are administered, followed by required monitoring for 30 minutes.     Either way is acceptable, and both are equally effective. With the rush protocol, the advantage is that less time is spent here for injections overall AND you would also reach maintenance dosing faster (which is when the clinical benefit starts to become more apparent). Not everyone is a candidate for rapid desensitization.   IF we proceed with the RUSH protocol, there are premedications which must be taken the day before and the day after the rush only (this includes antihistamines, steroids, and Singulair).  After  the rush day, no prednisone or Singulair is required, and we just recommend antihistamines taken on your injection day.  What Is An Estimate of the Costs?  If you are interested in starting allergy injections, please check with your insurance company about your coverage for both allergy vial sets and allergy injections.  Please do so prior to making the appointment to start injections.  The following are CPT codes to give to your insurance company. These are the amounts we BILL to the insurance company, but the amount YOU WILL PAY and WE RECEIVE IS SUBSTANTIALLY LESS and depends on the contracts we have with different insurance companies.   Amount Billed to Insurance One allergy vial set  CPT 95165   $ 1200     Two allergy vial set  CPT 95165   $ 2400     Three allergy vial set  CPT 95165   $ 3600     One injection   CPT 95115   $  35  Two injections   CPT 95117   $ 40 RUSH (Rapid Desensitization) CPT 95180 x 8 hours $500/hour  Regarding the allergy injections, your co-pay may or may not apply with each injection, so please confirm this with your insurance company. When you start allergy injections, 1 or 2 sets of vials are made based on your allergies.  Not all patients can be on one set of vials. A set of vials lasts 6 months to a year depending on how quickly you can proceed with your build-up of your allergy injections. Vials are personalized for each patient depending on their specific allergens.  How often are allergy injection given during the build-up period?   Injections are given at least weekly during the build-up period until your maintenance dose is achieved. Per the doctor's discretion, you may have the option of getting allergy injections two times per week during the build-up period. However, there must be at least 48 hours between injections. The build-up period is usually completed within 6-12 months depending on your ability to schedule injections and for adjustments for  reactions. When maintenance dose is reached, your injection schedule is gradually changed to every two weeks and later to every three weeks. Injections will then continue every 4 weeks. Usually, injections are continued for a total of 3-5 years.   When Will I Feel Better?  Some may experience decreased allergy symptoms during the build-up phase. For others, it may take as long as 12 months on the maintenance dose. If there is no improvement after a year of maintenance, your allergist will discuss other treatment options with you.  If you aren't responding to allergy shots, it may be because there is not enough dose of the allergen in your vaccine or there are missing allergens that were not identified during your allergy testing. Other reasons could be that there are high levels of the allergen in your environment or major exposure to non-allergic triggers like tobacco smoke.  What Is the Length of Treatment?  Once the maintenance dose is reached, allergy shots are generally continued for three to five years. The decision to stop should be discussed with your allergist at that time. Some people may experience a permanent reduction of allergy symptoms. Others may relapse and a longer course of allergy shots can be considered.  What Are the Possible Reactions?  The two types of adverse reactions that can occur with allergy shots are local and systemic. Common local reactions include very mild redness and swelling at the injection site, which can happen immediately or several hours after. Report a delayed reaction from your last injection. These include arm swelling or runny nose, watery eyes or cough that occurs within 12-24 hours after injection. A systemic reaction, which is less common, affects the entire body or a particular body system. They are usually mild and typically respond quickly to medications. Signs include increased allergy symptoms such as sneezing, a stuffy nose or hives.   Rarely, a  serious systemic reaction called anaphylaxis can develop. Symptoms include swelling in the throat, wheezing, a feeling of tightness in the chest, nausea or dizziness. Most serious systemic reactions develop within 30 minutes of allergy shots. This is why it is strongly recommended you wait in your doctor's office for 30 minutes after your injections. Your allergist is trained to watch for reactions, and his or her staff is trained and equipped with the proper medications to identify and treat them.   Report to the nurse immediately if  you experience any of the following symptoms: swelling, itching or redness of the skin, hives, watery eyes/nose, breathing difficulty, excessive sneezing, coughing, stomach pain, diarrhea, or light headedness. These symptoms may occur within 15-20 minutes after injection and may require medication.   Who Should Administer Allergy Shots?  The preferred location for receiving shots is your prescribing allergist's office. Injections can sometimes be given at another facility where the physician and staff are trained to recognize and treat reactions, and have received instructions by your prescribing allergist.  What if I am late for an injection?   Injection dose will be adjusted depending upon how many days or weeks you are late for your injection.   What if I am sick?   Please report any illness to the nurse before receiving injections. She may adjust your dose or postpone injections depending on your symptoms. If you have fever, flu, sinus infection or chest congestion it is best to postpone allergy injections until you are better. Never get an allergy injection if your asthma is causing you problems. If your symptoms persist, seek out medical care to get your health problem under control.  What If I am or Become Pregnant:  Women that become pregnant should schedule an appointment with [facility] before receiving any further allergy  injections.

## 2023-02-16 NOTE — Addendum Note (Signed)
Addended by: Kellie Simmering, [place] Hedman on: 02/16/2023 06:25 PM   Modules accepted: Orders

## 2023-02-16 NOTE — Progress Notes (Signed)
NEW PATIENT  Date of Service/Encounter:  02/16/23  Consult requested by: Hortencia Conradi, NP   Assessment:   Seasonal and perennial allergic rhinitis (grasses, weeds, trees, indoor molds, outdoor molds, dust mites, cat, dog, and tobacco)  Food intolerance - with negative testing to the most common foods   Complex past medical history, including fibromyalgia, irritable bowel syndrome,   Plan/Recommendations:    1. Chronic rhinitis - Testing today showed: grasses, weeds, trees, indoor molds, outdoor molds, dust mites, cat, dog, and tobacco - Copy of test results provided.  - Avoidance measures provided. - Stop taking:  - Continue with: Allegra (fexofenadine) 180mg  tablet once daily - Start taking: Astelin (azelastine) 2 sprays per nostril 1-2 times daily as needed ON BAD DAYS - You can use an extra dose of the antihistamine, if needed, for breakthrough symptoms.  - Consider nasal saline rinses 1-2 times daily to remove allergens from the nasal cavities as well as help with mucous clearance (this is especially helpful to do before the nasal sprays are given) - Consider allergy shots as a means of long-term control. - Allergy shots "re-train" and "reset" the immune system to ignore environmental allergens and decrease the resulting immune response to those allergens (sneezing, itchy watery eyes, runny nose, nasal congestion, etc).    - Allergy shots improve symptoms in 75-85% of patients.  - We can discuss more at the next appointment if the medications are not working for you.  2. Food intolerance - Testing to the most common foods was negative. - There is a the low positive predictive value of food allergy testing and hence the high possibility of false positives. - In contrast, food allergy testing has a high negative predictive value, therefore if testing is negative we can be relatively assured that they are indeed negative.  - This rules out anaphylactic reactions, but you  still might have intolerances, so avoid any foods that you think might be triggering your symptoms. - There is no need for an EpiPen at this point in time.   3. Return in about 3 months (around 05/19/2023).    This note in its entirety was forwarded to the Provider who requested this consultation.  Subjective:   Maria Middleton is a 72 y.o. female presenting today for evaluation of  Chief Complaint  Patient presents with   Establish Care    C/o of stomach irritation after eating feeling fatigue after eating.   Urticaria   Rash   Pruritus    Maria Middleton has a history of the following: Patient Active Problem List   Diagnosis Date Noted   Seasonal and perennial allergic rhinitis 02/16/2023   Food intolerance 02/16/2023   Other Specified Feeding or Eating Disorder, Emotional Eating Behaviors 01/14/2023   Abnormal metabolism 12/16/2022   Class 1 obesity with serious comorbidity and body mass index (BMI) of 31.0 to 31.9 in adult 12/16/2022   BMI 30.0-30.9,adult 11/19/2022   Food allergy 11/19/2022   Depression screen 11/04/2022   SOB (shortness of breath) on exertion 11/04/2022   Other fatigue 11/04/2022   Primary hypertension 10/29/2022   Generalized obesity 10/29/2022   Prediabetes 10/29/2022   Decreased GFR 10/29/2022   Renal mass 08/25/2017    History obtained from: chart review and patient.  Rhunette Croft was referred by Hortencia Conradi, NP.     Maria Middleton is a 72 y.o. female presenting for an evaluation of possible food allergies  and concern for environmental allergies.   Allergic  Rhinitis Symptom History: She reports that she is "feeling bad" frequently. She seems to think that this is related to  possibly seasonal allergies. This is sometimes related to foods as well. She reports that she has sinus pressure that "travels down her back" and it feels that "someone is pushing [her] shoulders down". She takes allegra during the spring nad the fall allergyu  season. She uses the Flonase every day which seems to help. She was "partially tested" once, but she is not sure that they did a good job. This was done in [place] somewhere. She never was offered a Academic librarian office appointment. She does get sinusitis a few times per year.   She reports that she tends to get hives a lot to various triggers.  Food Allergy Symptom History: She has the fatigue after she eats. She denies any particular food trigger. She has IBS and sees GI for this. She did have gastric ulcers at one point when she had an endoscopy done in February. She was placed on Nexium for this which she remains on at this point in time. This does seem to help a bit. But she is not sure that it does what "it should".  She does have cheese and milk listed on her allergy list. It is better now compared to when it first started. She has been doing a lot of fruit and low fat cottage cheese for breakfast. She thinks that she has gained some tolerance from this. She was a vegan for 30 years and was eating all organic foods. She was diagnosed with fibromyalgia 30 years ago and she healed this with foods and "[place] medicine".  She did have PNA once in the last ten years.  Otherwise, her infections send are mostly on urinary tract infection.   She does have a history of irritable bowel syndrome.  She follows with Quentin Mulling, a gastroenterology PA.  Her last visit was in June 2024. At that time, she was continued on Nexium daily.  Avoidance of spicy and acidic foods was recommended.  Limitation of coffee, carbonated beverages, and alcohol was recommended.  Otherwise, there is no history of other atopic diseases, including drug allergies, stinging insect allergies, or contact dermatitis. There is no significant infectious history. Vaccinations are up to date.    Past Medical History: Patient Active Problem List   Diagnosis Date Noted   Seasonal and perennial allergic rhinitis 02/16/2023   Food  intolerance 02/16/2023   Other Specified Feeding or Eating Disorder, Emotional Eating Behaviors 01/14/2023   Abnormal metabolism 12/16/2022   Class 1 obesity with serious comorbidity and body mass index (BMI) of 31.0 to 31.9 in adult 12/16/2022   BMI 30.0-30.9,adult 11/19/2022   Food allergy 11/19/2022   Depression screen 11/04/2022   SOB (shortness of breath) on exertion 11/04/2022   Other fatigue 11/04/2022   Primary hypertension 10/29/2022   Generalized obesity 10/29/2022   Prediabetes 10/29/2022   Decreased GFR 10/29/2022   Renal mass 08/25/2017    Medication List:  Allergies as of 02/16/2023       Reactions   Penicillin G Swelling   Prolia [denosumab] Other (See Comments)   Patient states she fainted   Sulfa Antibiotics Hives   Other Reaction: GI UPSET   Cheese    Congestion    Ciprofloxacin Swelling   Metronidazole Swelling   Milk-related Compounds    Congestion    Penicillins Nausea Only   Has patient had a PCN reaction causing immediate rash, facial/tongue/throat swelling,  SOB or lightheadedness with hypotension: no Has patient had a PCN reaction causing severe rash involving mucus membranes or skin necrosis: no Has patient had a PCN reaction that required hospitalization: no Has patient had a PCN reaction occurring within the last 10 years: no If all of the above answers are "NO", then may proceed with Cephalosporin use.   Sulfamethoxazole Rash        Medication List        Accurate as of February 16, 2023 12:27 PM. If you have any questions, ask your nurse or doctor.          B-12 50 MCG Tabs Take by mouth.   escitalopram 10 MG tablet Commonly known as: Lexapro Take 1 tablet (10 mg total) by mouth daily.   esomeprazole 40 MG packet Commonly known as: NexIUM Take 40 mg by mouth daily before breakfast. Take 1 tablet 30 mins before breakfast   fexofenadine 180 MG tablet Commonly known as: ALLEGRA Take 1 tablet (180 mg total) by mouth  daily. Started by: Alfonse Spruce   hydrochlorothiazide 25 MG tablet Commonly known as: HYDRODIURIL Take 25 mg by mouth daily.   LORazepam 0.5 MG tablet Commonly known as: ATIVAN Take 0.5 mg by mouth at bedtime as needed for sleep.   losartan 50 MG tablet Commonly known as: COZAAR Take 50 mg by mouth 2 (two) times daily.   MAGNESIUM PO Take 1 tablet by mouth daily. With vitamin d   milk thistle 175 MG tablet Take 175 mg by mouth daily.   NON FORMULARY Take 2 capsules by mouth as needed (for IBS ( triphala)).   OVER THE COUNTER MEDICATION Take 1 capsule by mouth at bedtime as needed (constipation). Cleanse More herbal laxative   PROBIOTIC BLEND PO Take by mouth.   QC TUMERIC COMPLEX PO Take by mouth.   Vitamin C Powd Take 1 Dose by mouth daily as needed (stress).        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  They live in a house that is 73 years old.  There is wood in the main living areas and carpeting in the bedroom.   They have gas heating and central cooling.  There are no animals inside or outside of the home.  There are dust mite covers on the back, but not the  pillows.  There is no tobacco exposure. She is currently retired. 6 she is not exposed to fumes, chemicals, or dust.  There is no HEPA filter in the home.  She does not live near an interstate or industrial area.  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  She does this twice a week.  She also stays busy with her grandkids.  Most recently, her 5 year old grandson stayed with her for a week and they did a lot of art projects.   Review of systems otherwise negative other than that mentioned in the HPI.    Objective:   Blood pressure (!) 170/108, pulse 62, temperature 97.9 F (36.6 C), height 5\' 3"  (1.6 m), weight 176 lb 12.8 oz (80.2 kg), SpO2 97%. Body mass index is 31.32 kg/m.     Physical Exam Vitals reviewed.  Constitutional:      Appearance: She is well-developed.     Comments: Very talkative.  HENT:     Head: Normocephalic and atraumatic.     Right Ear: Tympanic membrane, ear canal and external ear normal. No drainage, swelling or tenderness. Tympanic membrane is not injected, scarred, erythematous, retracted or bulging.     Left Ear: Tympanic membrane, ear canal and external ear normal. No drainage, swelling or tenderness. Tympanic membrane is not injected, scarred, erythematous, retracted or bulging.     Nose: No nasal deformity, septal deviation, mucosal edema or rhinorrhea.     Right Sinus: No maxillary sinus tenderness or frontal sinus tenderness.     Left Sinus: No maxillary sinus tenderness or frontal sinus tenderness.     Mouth/Throat:     Mouth: Mucous membranes are not pale and not dry.     Pharynx: Uvula midline.  Eyes:     General:        Right eye: No discharge.        Left eye: No discharge.      Conjunctiva/sclera: Conjunctivae normal.     Right eye: Right conjunctiva is not injected. No chemosis.    Left eye: Left conjunctiva is not injected. No chemosis.    Pupils: Pupils are equal, round, and reactive to light.  Cardiovascular:     Rate and Rhythm: Normal rate and regular rhythm.     Heart sounds: Normal heart sounds.  Pulmonary:     Effort: Pulmonary effort is normal. No tachypnea, accessory muscle usage or respiratory distress.     Breath sounds: Normal breath sounds. No wheezing, rhonchi or rales.  Chest:     Chest wall: No tenderness.  Abdominal:     Tenderness: There is no abdominal tenderness. There is no guarding or rebound.  Lymphadenopathy:     Head:     Right side of head: No submandibular, tonsillar or occipital adenopathy.     Left side of head: No submandibular, tonsillar or occipital adenopathy.     Cervical: No cervical adenopathy.  Skin:    Coloration: Skin is not pale.     Findings: No abrasion, erythema, petechiae or rash. Rash is not papular, urticarial or vesicular.  Neurological:     Mental Status: She is alert.  Psychiatric:        Behavior: Behavior is cooperative.      Diagnostic studies:    Allergy Studies:     Airborne Adult Perc - 02/16/23 1047     Time Antigen Placed 1030    Allergen  Manufacturer Greer    Location Back    Number of Test 55    1. Control-Buffer 50% Glycerol Negative    2. Control-Histamine 2+    3. Bahia Negative    4. French Southern Territories Negative    5. Johnson Negative    6. Kentucky Blue 2+    7. Meadow Fescue 2+    8. Perennial Rye 2+    9. Timothy Negative    10. Ragweed Mix Negative    11. Cocklebur 2+    12. Plantain,  English 2+    13. Baccharis 2+    14. Dog Fennel 2+    15. Guernsey Thistle 2+    16. Lamb's Quarters 2+    17. Sheep Sorrell Negative    18. Rough Pigweed 2+    19. Marsh Elder, Rough Negative    20. Mugwort, Common Negative    21. Box, Elder 3+    22. Cedar, red 2+    23. Sweet Gum Negative     24. Pecan Pollen 2+    25. Pine Mix 2+    26. Walnut, Black Pollen 3+    27. Red Mulberry 3+    28. Ash Mix 2+    29. Birch Mix Negative    30. Beech American Negative    31. Cottonwood, Guinea-Bissau Negative    32. Hickory, White Negative    33. Maple Mix Negative    34. Oak, Guinea-[place]iss[place] Mix Negative    35. Sycamore Eastern Negative    36. Alternaria Alternata 3+    37. Cladosporium Herbarum 3+    38. Aspergillus Mix Negative    39. Penicillium Mix Negative    40. Bipolaris Sorokiniana (Helminthosporium) Negative    41. Drechslera Spicifera (Curvularia) 2+    42. Mucor Plumbeus Negative    43. Fusarium Moniliforme Negative    44. Aureobasidium Pullulans (pullulara) Negative    45. Rhizopus Oryzae Negative    46. Botrytis Cinera Negative    47. Epicoccum Nigrum 3+    48. Phoma Betae 2+    49. Dust Mite Mix 3+    50. Cat Hair 10,000 BAU/ml 3+    51.  Dog Epithelia 2+    52. Mixed Feathers Negative    53. Horse Epithelia Negative    54. Cockroach, German Negative    55. Tobacco Leaf 3+             Intradermal - 02/16/23 1048     Time Antigen Placed 1100    Allergen Manufacturer Greer    Location Arm    Number of Test 8    Control Negative    Bahia Negative    French Southern Territories Negative    Johnson Negative    Ragweed Mix Negative    Mold 2 Negative    Mold 4 Negative    Cockroach Negative             Food Adult Perc - 02/16/23 1000     Time Antigen Placed 1030    Allergen Manufacturer Greer    Location Back    Number of allergen test 17    1. Peanut Negative    2. Soybean Negative    3. Wheat Negative    4. Sesame Negative    5. Milk, Cow Negative    6. Casein Negative    7. Egg White, Chicken Negative    8. Shellfish Mix Negative    9. Fish Mix Negative    10. Cashew Negative  11. Walnut Food Negative    12. Almond Negative    13. Hazelnut Negative    14. Pecan Food Negative    15. Pistachio Negative    16. [place] Nut Negative    17. Coconut Negative              Allergy testing results were read and interpreted by myself, documented by clinical staff.         Malachi Bonds, MD [facility]

## 2023-02-18 ENCOUNTER — Ambulatory Visit (INDEPENDENT_AMBULATORY_CARE_PROVIDER_SITE_OTHER): Payer: PPO | Admitting: Family Medicine

## 2023-02-18 ENCOUNTER — Encounter (INDEPENDENT_AMBULATORY_CARE_PROVIDER_SITE_OTHER): Payer: Self-pay | Admitting: Family Medicine

## 2023-02-18 VITALS — BP 133/91 | HR 63 | Temp 97.4°F | Ht 63.0 in | Wt 170.0 lb

## 2023-02-18 DIAGNOSIS — E669 Obesity, unspecified: Secondary | ICD-10-CM | POA: Diagnosis not present

## 2023-02-18 DIAGNOSIS — F439 Reaction to severe stress, unspecified: Secondary | ICD-10-CM | POA: Diagnosis not present

## 2023-02-18 DIAGNOSIS — Z6830 Body mass index (BMI) 30.0-30.9, adult: Secondary | ICD-10-CM

## 2023-02-18 DIAGNOSIS — I10 Essential (primary) hypertension: Secondary | ICD-10-CM

## 2023-02-18 NOTE — Progress Notes (Signed)
Chief Complaint:   OBESITY Maria Middleton is here to discuss her progress with her obesity treatment plan along with follow-up of her obesity related diagnoses. Maria Middleton is on the Category 1 Plan and states she is following her eating plan approximately 80% of the time. Maria Middleton states she is doing 0 minutes 0 times per week.  Today's visit was #: 7 Starting weight: 171 lbs Starting date: 11/04/2022 Today's weight: 170 lbs Today's date: 02/18/2023 Total lbs lost to date: 1 Total lbs lost since last in-office visit: 2  Interim History: Patient was sick with COVID since her last office visit.  She has tried to eat some protein.  She struggles to meet her protein goals.  Subjective:   1. White coat syndrome with diagnosis of hypertension Patient's blood pressure is elevated today.  She has a history of whitecoat syndrome.  2. Stress Patient feels she gets more upset with stressors in life.  She is on Lexapro.  Assessment/Plan:   1. White coat syndrome with diagnosis of hypertension Patient is to continue her medications, and check her blood pressure at home and bring in her log.  She will continue Cozaar as is.  2. Stress Patient is to increase Lexapro to 5 mg every other day and 10 mg on other days (no refill needed).  3. BMI 30.0-30.9,adult  4. Obesity, Beginning BMI 30.29 Maria Middleton is currently in the action stage of change. As such, her goal is to continue with weight loss efforts. She has agreed to the Category 1 Plan.   Behavioral modification strategies: increasing lean protein intake.  Maria Middleton has agreed to follow-up with our clinic in 4 weeks. She was informed of the importance of frequent follow-up visits to maximize her success with intensive lifestyle modifications for her multiple health conditions.   Objective:   Blood pressure (!) 133/91, pulse 63, temperature (!) 97.4 F (36.3 C), height 5\' 3"  (1.6 m), weight 170 lb (77.1 kg), SpO2 99%. Body mass index is 30.11  kg/m.  Lab Results  Component Value Date   CREATININE 1.06 (H) 11/04/2022   BUN 12 11/04/2022   NA 139 11/04/2022   K 4.1 11/04/2022   CL 99 11/04/2022   CO2 27 11/04/2022   Lab Results  Component Value Date   ALT 11 11/04/2022   AST 20 11/04/2022   ALKPHOS 90 11/04/2022   BILITOT 0.5 11/04/2022   Lab Results  Component Value Date   HGBA1C 5.9 (H) 11/04/2022   HGBA1C 6.0 (H) 02/27/2020   Lab Results  Component Value Date   INSULIN 6.2 11/04/2022   Lab Results  Component Value Date   TSH 1.880 11/04/2022   Lab Results  Component Value Date   CHOL 210 (H) 11/04/2022   HDL 64 11/04/2022   LDLCALC 129 (H) 11/04/2022   TRIG 95 11/04/2022   CHOLHDL 2.8 02/27/2020   Lab Results  Component Value Date   VD25OH 55.4 11/04/2022   Lab Results  Component Value Date   WBC 5.3 11/04/2022   HGB 15.3 11/04/2022   HCT 45.5 11/04/2022   MCV 91 11/04/2022   PLT 215 11/04/2022   No results found for: "IRON", "TIBC", "FERRITIN"  Attestation Statements:   Reviewed by clinician on day of visit: allergies, medications, problem list, medical history, surgical history, family history, social history, and previous encounter notes.  Time spent on visit including pre-visit chart review and post-visit care and charting was 30 minutes.   Trude Mcburney, am acting as transcriptionist  for Quillian Quince, MD.  I have reviewed the above documentation for accuracy and completeness, and I agree with the above. -  Quillian Quince, MD

## 2023-03-08 ENCOUNTER — Encounter (INDEPENDENT_AMBULATORY_CARE_PROVIDER_SITE_OTHER): Payer: PPO | Admitting: Psychology

## 2023-03-08 ENCOUNTER — Encounter (INDEPENDENT_AMBULATORY_CARE_PROVIDER_SITE_OTHER): Payer: Self-pay

## 2023-03-08 ENCOUNTER — Telehealth (INDEPENDENT_AMBULATORY_CARE_PROVIDER_SITE_OTHER): Payer: Self-pay | Admitting: Psychology

## 2023-03-08 NOTE — Telephone Encounter (Signed)
  Office: [phone]  /  Fax: [phone]  Date of Encounter: March 08, 2023  Time of Encounter: 2:32pm Duration of Encounter: ~18 minute(s) Provider: Lawerance Cruel, PsyD  CONTENT: [name] presented for today's appointment via MyChart Video Visit. She was unable to connect with video capabilities despite troubleshooting. To avoid any issues with insurance reimbursement, Paricia opted to reschedule today's appointment. No evidence or endorsement of safety concerns. All questions/concerns addressed.   PLAN: Kadiatou is scheduled for an appointment on 03/16/2023 at 2:30pm via MyChart Video Visit.

## 2023-03-08 NOTE — Progress Notes (Signed)
Entered in error

## 2023-03-09 ENCOUNTER — Ambulatory Visit: Payer: PPO | Admitting: Gastroenterology

## 2023-03-16 ENCOUNTER — Telehealth (INDEPENDENT_AMBULATORY_CARE_PROVIDER_SITE_OTHER): Payer: PPO | Admitting: Psychology

## 2023-03-17 ENCOUNTER — Telehealth: Payer: Self-pay | Admitting: Physician Assistant

## 2023-03-17 NOTE — Telephone Encounter (Signed)
Inbound call from patient, states she has been taking Pepcid but feels worse, she would like to discuss different alternatives. Patient also acknowledges she missed her last appointment, she states she can't wait until January. Please advise.

## 2023-03-18 NOTE — Telephone Encounter (Signed)
Pt having issues with reflux, states pepcid is making her feel sick. Scheduled pt to see Dr. Chales Abrahams 04/01/23 at 2:30pm. Pt aware of appt and apologized for her missed appt, states she did not know about the appt.

## 2023-03-23 ENCOUNTER — Ambulatory Visit (INDEPENDENT_AMBULATORY_CARE_PROVIDER_SITE_OTHER): Payer: PPO | Admitting: Family Medicine

## 2023-04-01 ENCOUNTER — Encounter: Payer: Self-pay | Admitting: Gastroenterology

## 2023-04-01 ENCOUNTER — Ambulatory Visit: Payer: PPO | Admitting: Gastroenterology

## 2023-04-01 VITALS — BP 102/80 | HR 64 | Ht 63.5 in | Wt 173.0 lb

## 2023-04-01 DIAGNOSIS — R1013 Epigastric pain: Secondary | ICD-10-CM

## 2023-04-01 DIAGNOSIS — Z8711 Personal history of peptic ulcer disease: Secondary | ICD-10-CM

## 2023-04-01 DIAGNOSIS — K449 Diaphragmatic hernia without obstruction or gangrene: Secondary | ICD-10-CM | POA: Diagnosis not present

## 2023-04-01 DIAGNOSIS — K581 Irritable bowel syndrome with constipation: Secondary | ICD-10-CM | POA: Diagnosis not present

## 2023-04-01 DIAGNOSIS — K219 Gastro-esophageal reflux disease without esophagitis: Secondary | ICD-10-CM

## 2023-04-01 NOTE — Progress Notes (Signed)
Chief Complaint:   Referring Provider:  Hortencia Conradi, NP      ASSESSMENT AND PLAN;   #1. GERD with HH with H/O gastric ulcers  #2. Epi pain. Neg RUQ [place], GES as below.  #3. IBS-C   Plan: -Rpt EGD to ensure healing of gastric ulcers. -HIDA with EF to rule out biliary dyskinesia. -Nexium 40mg  BID to continue -MiraLAX 17 g p.o. daily. -CT report from urology Sept 30   HPI:    Maria Middleton is a 72 y.o. female  With multiple medical problems as listed below including right RCC S/p partial nephrectomy.  For details see Amanda's last note dated 12/15/2022  Had significant epigastric pain with heartburn after eating tomatoes.  Has not been taking nonsteroidals.  Was quite concerned since previous EGD showed gastric ulcers.  Had negative right upper quadrant ultrasound for cholelithiasis.  Negative gastric emptying scan as below.  Symptoms do get worse with fatty foods.  Has chronic constipation with pellet-like stools and abdominal bloating.  Did get better with MiraLAX which she stopped on her own.  She got Linzess 145 which resulted in diarrhea.  Hence, she stopped.  Has not been sleeping well.  Clelia Croft has prescribed low-dose Ativan which does help.  She feels much better if she does water aerobics.  Anxiety definitely playing a role.  Had a CT Abdo/pelvis with contrast at [facility] urology September 30 for follow-up of RCC.  We do not have access to those records currently.   Wt Readings from Last 3 Encounters:  04/01/23 173 lb (78.5 kg)  02/18/23 170 lb (77.1 kg)  02/16/23 176 lb 12.8 oz (80.2 kg)     Past GI procedures: 08/20/2015 colonoscopy no polyps removed recall 10 years. She has had constipation and IBS-C, can not tolerate linzess.   07/2022 EGD with Dr. Chales Abrahams for GERD/nausea showed moderate hiatal, non bleeding gastric ulcers, duodenal erosions without bleeding. Negative H pylori gastric, negative celiac.   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    BMI 30.0-30.9,adult    Chest pain    Constipation    Cytomegaloviral disease (HCC)    Depression    Diarrhea    Dyslipidemia    Dysrhythmia    Hx LBBB   Edema of both lower extremities    Epstein Barr virus infection    Fibromyalgia    Headache    migraines   Hiatal hernia    Hormonal disorder    Hypertension    Hypertensive heart disease without congestive heart failure    Hypothyroidism, unspecified    Insomnia    Joint pain    Left shoulder pain    Lower abdominal pain    Malaise and fatigue    Nausea & vomiting    Neck pain    Night terrors    Nightmare disorder    Osteoporosis    Otogenic pain    Pain in shoulder region, right    Palpitations    Panic attack    Pneumonia    4 years ago   Renal cell carcinoma of left kidney (HCC)    Renal mass 08/25/2017   Scoliosis    Sinus problem  Sleep apnea    SOB (shortness of breath)    SOB (shortness of breath)    Stomach pain    Stomach ulcer    Vaginal discharge    Vitamin D deficiency     Past Surgical History:  Procedure Laterality Date   ABDOMINAL HYSTERECTOMY     one ovary left   ANKLE SURGERY     right ankle surgery   DILATION AND CURETTAGE OF UTERUS     ROBOTIC ASSITED PARTIAL NEPHRECTOMY Left 08/25/2017   Procedure: XI ROBOTIC ASSITED LEFT PARTIAL NEPHRECTOMY;  Surgeon: Crist Fat, MD;  Location: [facility];  Service: Urology;  Laterality: Left;   scharnoma     biopsy done to right abdomen   TUBAL LIGATION      Family History  Problem Relation Age of Onset   Hypertension Mother    Stroke Mother    Diabetes Mother    Heart disease Mother    Alcoholism Mother    Stroke Father    Hypertension Father    Heart disease Father    Emphysema Brother    Leukemia  Brother    Colon cancer Neg Hx    Esophageal cancer Neg Hx    Rectal cancer Neg Hx    Stomach cancer Neg Hx     Social History   Tobacco Use   Smoking status: Never    Passive exposure: Never   Smokeless tobacco: Never  Vaping Use   Vaping status: Never Used  Substance Use Topics   Alcohol use: Yes    Comment: socially   Drug use: No    Current Outpatient Medications  Medication Sig Dispense Refill   Ascorbic Acid (VITAMIN C) POWD Take 1 Dose by mouth daily as needed (stress).     Cyanocobalamin (B-12) 50 MCG TABS Take by mouth.     esomeprazole (NEXIUM) 40 MG packet Take 40 mg by mouth daily before breakfast. Take 1 tablet 30 mins before breakfast 90 each 4   fexofenadine (ALLEGRA) 180 MG tablet Take 1 tablet (180 mg total) by mouth daily. (Patient taking differently: Take 180 mg by mouth as needed. As needed) 30 tablet 5   hydrochlorothiazide (HYDRODIURIL) 25 MG tablet Take 25 mg by mouth daily.     losartan (COZAAR) 50 MG tablet Take 50 mg by mouth 2 (two) times daily.     MAGNESIUM PO Take 1 tablet by mouth daily. With vitamin d     milk thistle 175 MG tablet Take 175 mg by mouth daily.     NON FORMULARY Take 2 capsules by mouth as needed (for IBS ( triphala)).     OVER THE COUNTER MEDICATION Take 1 capsule by mouth at bedtime as needed (constipation). Cleanse More herbal laxative     Probiotic Product (PROBIOTIC BLEND PO) Take by mouth.     Turmeric (QC TUMERIC COMPLEX PO) Take by mouth.     No current facility-administered medications for this visit.    Allergies  Allergen Reactions   Penicillin G Swelling   Prolia [Denosumab] Other (See Comments)    Patient states she fainted   Sulfa Antibiotics Hives    Other Reaction: GI UPSET   Cheese     Congestion    Ciprofloxacin Swelling   Metronidazole Swelling   Milk-Related Compounds     Congestion    Penicillins Nausea Only    Has patient had a PCN reaction causing immediate rash, facial/tongue/throat swelling,  SOB or lightheadedness with hypotension: no Has patient  had a PCN reaction causing severe rash involving mucus membranes or skin necrosis: no Has patient had a PCN reaction that required hospitalization: no Has patient had a PCN reaction occurring within the last 10 years: no If all of the above answers are "NO", then may proceed with Cephalosporin use.    Sulfamethoxazole Rash    Review of Systems:  neg     Physical Exam:    BP 102/80   Pulse 64   Ht 5' 3.5" (1.613 m)   Wt 173 lb (78.5 kg)   SpO2 94%   BMI 30.16 kg/m  Wt Readings from Last 3 Encounters:  04/01/23 173 lb (78.5 kg)  02/18/23 170 lb (77.1 kg)  02/16/23 176 lb 12.8 oz (80.2 kg)   Constitutional:  Well-developed, in no acute distress. Psychiatric: Normal mood and affect. Behavior is normal. HEENT: Pupils normal.  Conjunctivae are normal. No scleral icterus. Neck supple.  Cardiovascular: Normal rate, regular rhythm. No edema Pulmonary/chest: Effort normal and breath sounds normal. No wheezing, rales or rhonchi. Abdominal: Soft, nondistended. Nontender. Bowel sounds active throughout. There are no masses palpable. No hepatomegaly. Rectal: Deferred Neurological: Alert and oriented to person place and time. Skin: Skin is warm and dry. No rashes noted.  Data Reviewed: I have personally reviewed following labs and imaging studies  CBC:    Latest Ref Rng & Units 11/04/2022    9:23 AM 08/26/2017    3:48 AM 08/25/2017   11:43 AM  CBC  WBC 3.4 - 10.8 x10E3/uL 5.3  7.0    Hemoglobin 11.1 - 15.9 g/dL 16.1  09.6  04.5   Hematocrit 34.0 - 46.6 % 45.5  34.5  35.7   Platelets 150 - 450 x10E3/uL 215  166      CMP:    Latest Ref Rng & Units 11/04/2022    9:23 AM 05/14/2020   11:05 AM 08/26/2017    3:48 AM  CMP  Glucose 70 - 99 mg/dL 87  85  91   BUN 8 - 27 mg/dL 12  12  11    Creatinine 0.57 - 1.00 mg/dL 4.09  8.11  9.14   Sodium 134 - 144 mmol/L 139  139  136   Potassium 3.5 - 5.2 mmol/L 4.1  4.0  3.7   Chloride  96 - 106 mmol/L 99  104  104   CO2 20 - 29 mmol/L 27  24  26    Calcium 8.7 - 10.3 mg/dL 9.6  9.3  8.5   Total Protein 6.0 - 8.5 g/dL 6.6     Total Bilirubin 0.0 - 1.2 mg/dL 0.5     Alkaline Phos 44 - 121 IU/L 90     AST 0 - 40 IU/L 20     ALT 0 - 32 IU/L 11           Edman Circle, MD 04/01/2023, 2:43 PM  Cc: Hortencia Conradi, NP

## 2023-04-01 NOTE — Patient Instructions (Signed)
_______________________________________________________  If your blood pressure at your visit was 140/90 or greater, please contact your primary care physician to follow up on this.  _______________________________________________________  If you are age 73 or older, your body mass index should be between 23-30. Your Body mass index is 30.16 kg/m. If this is out of the aforementioned range listed, please consider follow up with your Primary Care Provider.  If you are age 43 or younger, your body mass index should be between 19-25. Your Body mass index is 30.16 kg/m. If this is out of the aformentioned range listed, please consider follow up with your Primary Care Provider.   ________________________________________________________  The [facility] providers would like to encourage you to use [facility] to communicate with providers for non-urgent requests or questions.  Due to long hold times on the telephone, sending your provider a message by [facility] may be a faster and more efficient way to get a response.  Please allow 48 business hours for a response.  Please remember that this is for non-urgent requests.  _______________________________________________________  Continue Nexium 40mg  2 times a day  You have been scheduled for an endoscopy. Please follow written instructions given to you at your visit today.  If you use inhalers (even only as needed), please bring them with you on the day of your procedure.  If you take any of the following medications, they will need to be adjusted prior to your procedure:   DO NOT TAKE 7 DAYS PRIOR TO TEST- Trulicity (dulaglutide) Ozempic, Wegovy (semaglutide) Mounjaro (tirzepatide) Bydureon Bcise (exanatide extended release)  DO NOT TAKE 1 DAY PRIOR TO YOUR TEST Rybelsus (semaglutide) Adlyxin (lixisenatide) Victoza (liraglutide) Byetta (exanatide) ___________________________________________________________________________   You have been  scheduled for a HIDA scan at [facility]iology (1st floor) on 04-08-2023. Please arrive 30 minutes prior to your scheduled appointment at  [address]. Make certain not to have anything to eat or drink at least midnight prior to your test. Should this appointment date or time not work well for you, please call radiology scheduling at [phone].  _____________________________________________________________________ hepatobiliary (HIDA) scan is an imaging procedure used to diagnose problems in the liver, gallbladder and bile ducts. In the HIDA scan, a radioactive chemical or tracer is injected into a vein in your arm. The tracer is handled by the liver like bile. Bile is a fluid produced and excreted by your liver that helps your digestive system break down fats in the foods you eat. Bile is stored in your gallbladder and the gallbladder releases the bile when you eat a meal. A special nuclear medicine scanner (gamma camera) tracks the flow of the tracer from your liver into your gallbladder and small intestine.  During your HIDA scan  You'll be asked to change into a hospital gown before your HIDA scan begins. Your health care team will position you on a table, usually on your back. The radioactive tracer is then injected into a vein in your arm.The tracer travels through your bloodstream to your liver, where it's taken up by the bile-producing cells. The radioactive tracer travels with the bile from your liver into your gallbladder and through your bile ducts to your small intestine.You may feel some pressure while the radioactive tracer is injected into your vein. As you lie on the table, a special gamma camera is positioned over your abdomen taking pictures of the tracer as it moves through your body. The gamma camera takes pictures continually for about an hour. You'll need to keep still during  the HIDA scan. This can become uncomfortable, but you may find that you can lessen the discomfort by taking deep  breaths and thinking about other things. Tell your health care team if you're uncomfortable. The radiologist will watch on a computer the progress of the radioactive tracer through your body. The HIDA scan may be stopped when the radioactive tracer is seen in the gallbladder and enters your small intestine. This typically takes about an hour. In some cases extra imaging will be performed if original images aren't satisfactory, if morphine is given to help visualize the gallbladder or if the medication CCK is given to look at the contraction of the gallbladder. This test typically takes 2 hours to complete. ________________________________________________________________________  Thank you,  Dr. Lynann Bologna

## 2023-04-02 ENCOUNTER — Encounter: Payer: Self-pay | Admitting: Gastroenterology

## 2023-04-05 ENCOUNTER — Telehealth (INDEPENDENT_AMBULATORY_CARE_PROVIDER_SITE_OTHER): Payer: PPO | Admitting: Psychology

## 2023-04-08 ENCOUNTER — Encounter (HOSPITAL_COMMUNITY)
Admission: RE | Admit: 2023-04-08 | Discharge: 2023-04-08 | Disposition: A | Discharge status: Home / Self Care | Payer: PPO | Source: Ambulatory Visit | Attending: Gastroenterology | Admitting: Gastroenterology

## 2023-04-08 DIAGNOSIS — R1013 Epigastric pain: Secondary | ICD-10-CM | POA: Insufficient documentation

## 2023-04-08 DIAGNOSIS — K449 Diaphragmatic hernia without obstruction or gangrene: Secondary | ICD-10-CM | POA: Diagnosis present

## 2023-04-08 DIAGNOSIS — K219 Gastro-esophageal reflux disease without esophagitis: Secondary | ICD-10-CM | POA: Diagnosis present

## 2023-04-08 DIAGNOSIS — Z8711 Personal history of peptic ulcer disease: Secondary | ICD-10-CM | POA: Insufficient documentation

## 2023-04-08 MED ORDER — TECHNETIUM TC 99M MEBROFENIN IV KIT
5.5000 | PACK | Freq: Once | INTRAVENOUS | Status: AC | PRN
  Administered 2023-04-08: 5.5 via INTRAVENOUS

## 2023-04-13 ENCOUNTER — Encounter: Payer: PPO | Admitting: Gastroenterology

## 2023-04-16 ENCOUNTER — Encounter: Payer: Self-pay | Admitting: Gastroenterology

## 2023-04-16 ENCOUNTER — Ambulatory Visit: Payer: PPO | Admitting: Gastroenterology

## 2023-04-16 ENCOUNTER — Telehealth: Payer: Self-pay | Admitting: Gastroenterology

## 2023-04-16 VITALS — BP 141/83 | HR 66 | Temp 97.8°F | Resp 13 | Ht 63.0 in | Wt 173.0 lb

## 2023-04-16 DIAGNOSIS — K2980 Duodenitis without bleeding: Secondary | ICD-10-CM | POA: Diagnosis not present

## 2023-04-16 DIAGNOSIS — Z8711 Personal history of peptic ulcer disease: Secondary | ICD-10-CM

## 2023-04-16 DIAGNOSIS — K2950 Unspecified chronic gastritis without bleeding: Secondary | ICD-10-CM | POA: Diagnosis not present

## 2023-04-16 DIAGNOSIS — K269 Duodenal ulcer, unspecified as acute or chronic, without hemorrhage or perforation: Secondary | ICD-10-CM

## 2023-04-16 DIAGNOSIS — R1013 Epigastric pain: Secondary | ICD-10-CM

## 2023-04-16 MED ORDER — SODIUM CHLORIDE 0.9 % IV SOLN: 500.0000 mL | Freq: Once | INTRAVENOUS | Status: DC

## 2023-04-16 NOTE — Telephone Encounter (Signed)
Inbound call from patient stating she had a endoscopy this morning. States she had been feeling a lot of pain when she tries to swallow. States she did not experience this pain when having a endoscopy prior to today. Patient requesting a call back. Please advise thank you.

## 2023-04-16 NOTE — Op Note (Signed)
[facility] Patient Name: Maria Middleton Procedure Date: 04/16/2023 7:56 AM MRN: [id number] Endoscopist: Lynann Bologna , MD, [id number] Age: 72 Referring MD:  Date of Birth: 12-22-1950 Gender: Female Account #: [id number] Procedure:                Upper GI endoscopy Indications:              FU gastric ulcers 08/14/2022 Medicines:                Monitored Anesthesia Care Procedure:                Pre-Anesthesia Assessment:                           - Prior to the procedure, a History and Physical                            was performed, and patient medications and                            allergies were reviewed. The patient's tolerance of                            previous anesthesia was also reviewed. The risks                            and benefits of the procedure and the sedation                            options and risks were discussed with the patient.                            All questions were answered, and informed consent                            was obtained. Prior Anticoagulants: The patient has                            taken no anticoagulant or antiplatelet agents. ASA                            Grade Assessment: II - A patient with mild systemic                            disease. After reviewing the risks and benefits,                            the patient was deemed in satisfactory condition to                            undergo the procedure.                           After obtaining informed consent, the endoscope was  passed under direct vision. Throughout the                            procedure, the patient's blood pressure, pulse, and                            oxygen saturations were monitored continuously. The                            GIF [id number] #[id number] was introduced through the                            mouth, and advanced to the second part of duodenum.                            The upper GI endoscopy  was accomplished without                            difficulty. The patient tolerated the procedure                            well. Scope In: Scope Out: Findings:                 The lower third of the esophagus was moderately                            tortuous.                           A 4 cm hiatal hernia was present.                           The entire examined stomach was normal. Gastric                            ulcers had healed completely. Biopsies were taken                            with a cold forceps for histology.                           A few localized, 4 mm healing erosions without                            bleeding were found in the first and 2nd portion of                            the duodenum. Biopsies were taken with a cold                            forceps for histology. Complications:            No immediate complications. Estimated Blood Loss:     Estimated blood loss: none. Impression:               -  4 cm hiatal hernia.                           - Normal stomach. Biopsied.                           - Few healing erosions in duodenum. Biopsied. Recommendation:           - Patient has a contact number available for                            emergencies. The signs and symptoms of potential                            delayed complications were discussed with the                            patient. Return to normal activities tomorrow.                            Written discharge instructions were provided to the                            patient.                           - Resume previous diet.                           - Continue present medications. Decrease nexium to                            40mg  po QD. Avoid NSAIDs.                           - Await pathology results.                           - The findings and recommendations were discussed                            with the patient's family. Lynann Bologna, MD 04/16/2023 8:39:37 AM This  report has been signed electronically.

## 2023-04-16 NOTE — Progress Notes (Signed)
Called to room to assist during endoscopic procedure.  Patient ID and intended procedure confirmed with present staff. Received instructions for my participation in the procedure from the performing physician.  

## 2023-04-16 NOTE — Progress Notes (Signed)
Pt's states no medical or surgical changes since previsit or office visit. 

## 2023-04-16 NOTE — Progress Notes (Signed)
Sedate, gd SR, tolerated procedure well, VSS, report to RN 

## 2023-04-16 NOTE — Telephone Encounter (Signed)
Returned the patient's phone call.She reports "an extremely scratchy throat upon swallowing." She was fine this morning after the procedure. Notified Dr. Chales Abrahams and he is going to follow up with the patient.

## 2023-04-16 NOTE — Patient Instructions (Addendum)
- Resume previous diet.                           - Continue present medications. Decrease nexium to                            40mg  po QD. Avoid NSAIDs.                           - Await pathology results.                           - The findings and recommendations were discussed                            with the patient's family.  YOU HAD AN ENDOSCOPIC PROCEDURE TODAY AT THE [facility]:   Refer to the procedure report that was given to you for any specific questions about what was found during the examination.  If the procedure report does not answer your questions, please call your gastroenterologist to clarify.  If you requested that your care partner not be given the details of your procedure findings, then the procedure report has been included in a sealed envelope for you to review at your convenience later.  YOU SHOULD EXPECT: Some feelings of bloating in the abdomen. Passage of more gas than usual.  Walking can help get rid of the air that was put into your GI tract during the procedure and reduce the bloating. If you had a lower endoscopy (such as a colonoscopy or flexible sigmoidoscopy) you may notice spotting of blood in your stool or on the toilet paper. If you underwent a bowel prep for your procedure, you may not have a normal bowel movement for a few days.  Please Note:  You might notice some irritation and congestion in your nose or some drainage.  This is from the oxygen used during your procedure.  There is no need for concern and it should clear up in a day or so.  SYMPTOMS TO REPORT IMMEDIATELY:   Following upper endoscopy (EGD)  Vomiting of blood or coffee ground material  New chest pain or pain under the shoulder blades  Painful or persistently difficult swallowing  New shortness of breath  Fever of 100F or higher  Black, tarry-looking stools  For urgent or emergent issues, a gastroenterologist can be reached at any hour by  calling [phone]. Do not use MyChart messaging for urgent concerns.    DIET:  We do recommend a small meal at first, but then you may proceed to your regular diet.  Drink plenty of fluids but you should avoid alcoholic beverages for 24 hours.  ACTIVITY:  You should plan to take it easy for the rest of today and you should NOT DRIVE or use heavy machinery until tomorrow (because of the sedation medicines used during the test).    FOLLOW UP: Our staff will call the number listed on your records the next business day following your procedure.  We will call around 7:15- 8:00 am to check on you and address any questions or concerns that you may have regarding the information given to you following your procedure. If we do not reach you, we will leave a message.     If any  biopsies were taken you will be contacted by phone or by letter within the next 1-3 weeks.  Please call us at [phone] if you have not heard about the biopsies in 3 weeks.    SIGNATURES/CONFIDENTIALITY: You and/or your care partner have signed paperwork which will be entered into your electronic medical record.  These signatures attest to the fact that that the information above on your After Visit Summary has been reviewed and is understood.  Full responsibility of the confidentiality of this discharge information lies with you and/or your care-partner.

## 2023-04-19 ENCOUNTER — Telehealth: Payer: Self-pay | Admitting: *Deleted

## 2023-04-19 NOTE — Telephone Encounter (Signed)
  Follow up Call-     04/16/2023    7:19 AM 08/14/2022    9:27 AM  Call back number  Post procedure Call Back phone  # [phone]  Permission to leave phone message Yes Yes     Patient questions:  Do you have a fever, pain , or abdominal swelling? No. Pain Score  0 *  Have you tolerated food without any problems? Yes.    Have you been able to return to your normal activities? Yes.    Do you have any questions about your discharge instructions: Diet   No. Medications  No. Follow up visit  No.  Do you have questions or concerns about your Care? No.  Actions: * If pain score is 4 or above: No action needed, pain <4.

## 2023-04-20 LAB — SURGICAL PATHOLOGY

## 2023-04-22 NOTE — Telephone Encounter (Signed)
Delayed note: I talked to her the same day.  She was feeling better RG

## 2023-04-30 ENCOUNTER — Encounter: Payer: Self-pay | Admitting: Gastroenterology

## 2023-05-03 ENCOUNTER — Telehealth: Payer: Self-pay | Admitting: Allergy & Immunology

## 2023-05-03 DIAGNOSIS — J3089 Other allergic rhinitis: Secondary | ICD-10-CM | POA: Diagnosis not present

## 2023-05-03 DIAGNOSIS — J302 Other seasonal allergic rhinitis: Secondary | ICD-10-CM

## 2023-05-03 NOTE — Telephone Encounter (Signed)
Patient called requesting initiation of allergen immunotherapy. Script written.   Malachi Bonds, MD [facility]

## 2023-05-03 NOTE — Progress Notes (Signed)
Aeroallergen Immunotherapy  Ordering Provider: Dr. Malachi Bonds  Patient Details Name: Maria Middleton MRN: [id number] Date of Birth: [birth year]  Order 1 of 2  Vial Label: G/W/T/C/D  0.3 ml (Volume)  BAU Concentration -- 7 Grass Mix* 100,000 ([address] [place], [place], [place], [place], RedTop, Sweet Vernal, Timothy) 0.2 ml (Volume)  1:20 Concentration -- Cocklebur 0.2 ml (Volume)  1:20 Concentration -- [place] Thistle 0.2 ml (Volume)  1:40 Concentration -- Baccharis 0.2 ml (Volume)  1:80 Concentration -- Dogfennel 0.5 ml (Volume)  1:20 Concentration -- Weed Mix* 0.5 ml (Volume)  1:20 Concentration -- Eastern 10 Tree Mix (also Sweet Gum) 0.2 ml (Volume)  1:10 Concentration -- Cedar, red 0.2 ml (Volume)  1:10 Concentration -- Pecan Pollen 0.2 ml (Volume)  1:20 Concentration -- Red Mulberry 0.2 ml (Volume)  1:20 Concentration -- Walnut, Black Pollen 0.5 ml (Volume)  1:10 Concentration -- Cat Hair 0.5 ml (Volume)  1:10 Concentration -- Dog Epithelia   3.9  ml Extract Subtotal 1.1  ml Diluent 5.0  ml Maintenance Total  Schedule:  B  Blue Vial (1:100,000): Schedule B (6 doses) Yellow Vial (1:10,000): Schedule B (6 doses) Green Vial (1:1,000): Schedule B (6 doses) Red Vial (1:100): Schedule A (14 doses)  Special Instructions: After completion of the first Red Vial, please space to every two weeks. After completion of the second Red Vial, please space to every 4 weeks. Ok to up dose new vials at 0.42mL --> 0.3 mL --> 0.5 mL. Ok to come twice weekly, if desired, as long as there is 48 hours between injections.

## 2023-05-03 NOTE — Progress Notes (Signed)
EXP 05/02/24

## 2023-05-03 NOTE — Progress Notes (Signed)
Aeroallergen Immunotherapy   Ordering Provider: Dr. Malachi Bonds   Patient Details  Name: Maria Middleton  MRN: [id number]  Date of Birth: Oct 21, 1950   Order 2 of 2   Vial Label: Molds/DM   0.2 ml (Volume)  1:20 Concentration -- Alternaria alternata  0.2 ml (Volume)  1:20 Concentration -- Cladosporium herbarum  0.2 ml (Volume)  1:20 Concentration -- Drechslera spicifera  0.2 ml (Volume)  1:40 Concentration -- Epicoccum nigrum  0.2 ml (Volume)  1:40 Concentration -- Phoma betae  0.8 ml (Volume)   AU Concentration -- Mite Mix (DF 5,000 & DP 5,000)    1.8  ml Extract Subtotal  3.2  ml Diluent  5.0  ml Maintenance Total   Schedule:  B   Blue Vial (1:100,000): Schedule B (6 doses)  Yellow Vial (1:10,000): Schedule B (6 doses)  Green Vial (1:1,000): Schedule B (6 doses)  Red Vial (1:100): Schedule A (14 doses)   Special Instructions: After completion of the first Red Vial, please space to every two weeks. After completion of the second Red Vial, please space to every 4 weeks. Ok to up dose new vials at 0.45mL --> 0.3 mL --> 0.5 mL. Ok to come twice weekly, if desired, as long as there is 48 hours between injections.

## 2023-05-04 DIAGNOSIS — J3081 Allergic rhinitis due to animal (cat) (dog) hair and dander: Secondary | ICD-10-CM | POA: Diagnosis not present

## 2023-05-12 ENCOUNTER — Ambulatory Visit: Payer: PPO

## 2023-05-12 ENCOUNTER — Ambulatory Visit (INDEPENDENT_AMBULATORY_CARE_PROVIDER_SITE_OTHER): Payer: PPO | Admitting: *Deleted

## 2023-05-12 DIAGNOSIS — J309 Allergic rhinitis, unspecified: Secondary | ICD-10-CM

## 2023-05-12 MED ORDER — EPINEPHRINE 0.3 MG/0.3ML IJ SOAJ: INTRAMUSCULAR | 3 refills | Status: AC

## 2023-05-12 NOTE — Progress Notes (Signed)
Immunotherapy   Patient Details  Name: Maria Middleton MRN: [id number] Date of Birth: 1950/08/10  05/12/2023  Rhunette Croft started injections for Mold/DM & G/W/T/C/D exp: 05/02/2024 Following schedule: B  Frequency:2 times per week Epi-Pen:Prescription for Epi-Pen given Consent signed and patient instructions given.  No issues after observed wait time.   Redge Gainer 05/12/2023, 3:02 PM

## 2023-05-14 ENCOUNTER — Telehealth: Payer: Self-pay | Admitting: Gastroenterology

## 2023-05-14 NOTE — Telephone Encounter (Signed)
Patient called and stated that she had some lab work done to determine if she had Silica  disease. And she was wondering if Dr. Chales Abrahams had the results yet. Patient requested a call back. Please advise.

## 2023-05-17 NOTE — Telephone Encounter (Signed)
Left message for pt to call back  °

## 2023-05-17 NOTE — Telephone Encounter (Signed)
Pt stated that she was sent to [facility] to have labs done and wondering if Dr. Chales Abrahams has reviewed the labs.  Nehemiah Settle have you seen the labs faxed over.

## 2023-05-18 NOTE — Telephone Encounter (Signed)
Labs were reviewed. Negative celiac serology from 05/12/2023 RG

## 2023-05-18 NOTE — Telephone Encounter (Signed)
Results placed on Dr. Urban Gibson desk.

## 2023-05-18 NOTE — Telephone Encounter (Signed)
I did fax a request for them today

## 2023-05-18 NOTE — Telephone Encounter (Signed)
No I haven't seen them

## 2023-05-18 NOTE — Telephone Encounter (Signed)
Inbound call from patient requesting a call to discuss if previous labs were received for review. Please advise, thank you.

## 2023-05-19 ENCOUNTER — Ambulatory Visit: Payer: PPO | Admitting: Allergy and Immunology

## 2023-05-19 NOTE — Telephone Encounter (Signed)
Pt was made aware of recent results. Pt stated that she would like to schedule an office visit with Dr. Chales Abrahams. Pt was scheduled for 06/14/2023 at 8:50 AM. Pt made aware. Pt verbalized understanding with all questions answered.

## 2023-05-19 NOTE — Telephone Encounter (Signed)
Patient called to get results. Is highly concerned.

## 2023-06-14 ENCOUNTER — Ambulatory Visit: Payer: PPO | Admitting: Gastroenterology

## 2023-06-14 ENCOUNTER — Encounter: Payer: Self-pay | Admitting: Gastroenterology

## 2023-06-14 VITALS — BP 124/70 | HR 72 | Ht 63.0 in | Wt 174.0 lb

## 2023-06-14 DIAGNOSIS — M6289 Other specified disorders of muscle: Secondary | ICD-10-CM

## 2023-06-14 DIAGNOSIS — R109 Unspecified abdominal pain: Secondary | ICD-10-CM | POA: Diagnosis not present

## 2023-06-14 DIAGNOSIS — K219 Gastro-esophageal reflux disease without esophagitis: Secondary | ICD-10-CM | POA: Diagnosis not present

## 2023-06-14 DIAGNOSIS — K449 Diaphragmatic hernia without obstruction or gangrene: Secondary | ICD-10-CM

## 2023-06-14 DIAGNOSIS — R1084 Generalized abdominal pain: Secondary | ICD-10-CM

## 2023-06-14 DIAGNOSIS — M9905 Segmental and somatic dysfunction of pelvic region: Secondary | ICD-10-CM | POA: Diagnosis not present

## 2023-06-14 DIAGNOSIS — D3616 Benign neoplasm of peripheral nerves and autonomic nervous system of pelvis: Secondary | ICD-10-CM

## 2023-06-14 DIAGNOSIS — K581 Irritable bowel syndrome with constipation: Secondary | ICD-10-CM | POA: Diagnosis not present

## 2023-06-14 MED ORDER — LINACLOTIDE 72 MCG PO CAPS: 72.0000 ug | ORAL_CAPSULE | Freq: Every day | ORAL | 3 refills | Status: AC

## 2023-06-14 NOTE — Patient Instructions (Signed)
We have sent the following medications to your pharmacy for you to pick up at your convenience:  Linzess  Linzess works best when taken once a day every day, on an empty stomach, at least 30 minutes before a meal.  When Linzess is taken daily as directed:  *Constipation relief is typically felt in about a week *IBS-C patients may begin to experience relief from belly pain and overall abdominal symptoms (pain, discomfort, and bloating) in about 1 week,   with symptoms typically improving over 12 weeks.  Diarrhea may occur in the first 2 weeks -keep taking it.  The diarrhea should go away and you should start having normal, complete, full bowel movements. It may be helpful to start treatment when you can be near the comfort of your own bathroom, such as a weekend.   Take 17 gm of Miralax daily.  I have put in a referral for pelvic floor therapy  Please call back in about a week to schedule a colonoscopy.

## 2023-06-14 NOTE — Progress Notes (Signed)
Chief Complaint: FU  Referring Provider:  Hortencia Conradi, NP      ASSESSMENT AND PLAN;   #1.  Chronic Abdo pain. Neg CT AP 03/2023 with contrast, [place], GES, EGD as below. Neg celiac screen  #2. IBS-C with pelvic floor dysfunction  #3. GERD with HH  #4. 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 No need for any intervention per [facility].   Plan: -Linzess po every day (samples) -Increased Miralax 17g po BID -Colon with 2 day prep (d/t continued symptoms) -MiraLAX 17 g p.o. daily. -Resume Pelvic PT as before. Will need ref.   Discussed risks & benefits of colonoscopy. Risks including rare perforation req laparotomy, bleeding after bx/polypectomy req blood transfusion, rarely missing neoplasms, risks of anesthesia/sedation, rare risk of damage to internal organs. Benefits outweigh the risks. Patient agrees to proceed. All the questions were answered. Pt consents to proceed. HPI:    Maria Middleton is a 72 y.o. female  With multiple medical problems as listed below including right RCC S/p partial nephrectomy.  For details see Amanda's last note dated 12/15/2022  Discussed the use of AI scribe software for clinical note transcription with the patient, who gave verbal consent to proceed.  History of Present Illness   The patient presents with a longstanding issue of constipation, which has been a lifelong problem. They report that their stomach hurts every time they eat, with the pain localized around and above the belly button. The patient has a benign growth in their stomach, which was biopsied in 2019 and is not believed to be contributing to their current symptoms.  The patient's constipation has been resistant to multiple interventions, including Miralax and Benefiber, which they take a few days a week. They also resort to a combination of Colace and Senna, a stool softener and stimulant, respectively, when other measures fail. However, the patient reports  that these interventions are becoming less effective over time.  The patient has not had a bowel movement since the previous Friday, despite efforts to increase water intake. They express concern about the potential toxicity from not being able to move their bowels regularly. The patient also reports that their stomach feels bloated after eating, which they attribute to their constipation.  The patient has previously undergone physical therapy for their bowel issues, which they report was the most effective intervention for their constipation. They express a desire to resume this therapy.  The patient has a history of ulcers, which have healed, and they are currently taking Nexium once a day. They also have a benign retrocaval mass, which has been stable and was previously biopsied. The patient's weight has remained stable, and they do not report any issues with nutrient absorption.  The patient's last colonoscopy was in 2017, and they are open to having another one in the next six months to rule out any colonic etiology.     She got Linzess 145 which resulted in diarrhea.  Hence, she stopped.  Willing to try lower dose.  Has not been sleeping well.  Maria Middleton has prescribed low-dose Ativan which does help.  She feels much better if she does water aerobics.  Anxiety definitely playing a role.  Had a CT Abdo/pelvis with contrast at [facility] urology October 2024  for follow-up of RCC-negative as below.   Wt Readings from Last 3 Encounters:  06/14/23 174 lb (78.9 kg)  04/16/23 173 lb (78.5 kg)  04/01/23 173 lb (78.5 kg)     Past  GI procedures: HIDA with EF 03/2023 IMPRESSION: 1.  Patent cystic and common bile ducts. 2.  Normal gallbladder ejection fraction.  EGD 03/2023 - 4 cm hiatal hernia. - Normal stomach. Bx- neg HP. - Few healing erosions in duodenum. Bx- Mild villous blunting - Negative celiac serology.  CT AP with contrast 03/2023 -Stable chronic retrocaval mass -Stable  appearance of left kidney status post partial nephrectomy.  No evidence of metastatic disease. -Prominent stool throughout the colon consistent with constipation.  08/20/2015 colonoscopy no polyps removed recall 10 years. She has had constipation and IBS-C, can not tolerate linzess.   07/2022 EGD with Dr. Chales Abrahams for GERD/nausea showed moderate hiatal, non bleeding gastric ulcers, duodenal erosions without bleeding. Negative H pylori gastric, negative celiac.   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    BMI 30.0-30.9,adult    Chest pain    Constipation    Cytomegaloviral disease (HCC)    Depression    Diarrhea    Dyslipidemia    Dysrhythmia    Hx LBBB   Edema of both lower extremities    Epstein Barr virus infection    Fibromyalgia    Headache    migraines   Hiatal hernia    Hormonal disorder    Hypertension    Hypertensive heart disease without congestive heart failure    Hypothyroidism, unspecified    Insomnia    Joint pain    Left shoulder pain    Lower abdominal pain    Malaise and fatigue    Nausea & vomiting    Neck pain    Night terrors    Nightmare disorder    Osteoporosis    Otogenic pain    Pain in shoulder region, right    Palpitations    Panic attack    Pneumonia    4 years ago   Renal cell carcinoma of left kidney (HCC)    Renal mass 08/25/2017   Scoliosis    Sinus problem    Sleep apnea    SOB (shortness of breath)    SOB (shortness of breath)    Stomach pain    Stomach ulcer    Vaginal discharge    Vitamin D deficiency     Past Surgical History:  Procedure Laterality Date   ABDOMINAL HYSTERECTOMY     one ovary left   ANKLE SURGERY     right ankle surgery   DILATION AND CURETTAGE OF UTERUS     ROBOTIC ASSITED PARTIAL  NEPHRECTOMY Left 08/25/2017   Procedure: XI ROBOTIC ASSITED LEFT PARTIAL NEPHRECTOMY;  Surgeon: Crist Fat, MD;  Location: [facility];  Service: Urology;  Laterality: Left;   scharnoma     biopsy done to right abdomen   TUBAL LIGATION     UPPER GASTROINTESTINAL ENDOSCOPY      Family History  Problem Relation Age of Onset   Hypertension Mother    Stroke Mother    Diabetes Mother    Heart disease Mother    Alcoholism Mother    Stroke Father    Hypertension Father    Heart disease Father    Emphysema Brother    Leukemia Brother    Colon cancer Neg Hx    Esophageal  cancer Neg Hx    Rectal cancer Neg Hx    Stomach cancer Neg Hx     Social History   Tobacco Use   Smoking status: Never    Passive exposure: Never   Smokeless tobacco: Never  Vaping Use   Vaping status: Never Used  Substance Use Topics   Alcohol use: Yes    Comment: socially   Drug use: No    Current Outpatient Medications  Medication Sig Dispense Refill   Ascorbic Acid (VITAMIN C) POWD Take 1 Dose by mouth daily as needed (stress).     cholecalciferol (VITAMIN D3) 25 MCG (1000 UNIT) tablet Take 1,000 Units by mouth daily.     Cyanocobalamin (B-12) 50 MCG TABS Take by mouth.     EPINEPHrine 0.3 mg/0.3 mL IJ SOAJ injection Use as directed for life threatening allergic reactions 2 each 3   esomeprazole (NEXIUM) 40 MG capsule Take 40 mg by mouth daily.     fexofenadine (ALLEGRA) 180 MG tablet Take 1 tablet (180 mg total) by mouth daily. (Patient taking differently: Take 180 mg by mouth as needed. As needed) 30 tablet 5   fluticasone (FLONASE) 50 MCG/ACT nasal spray Place 1 spray into both nostrils daily.     hydrochlorothiazide (HYDRODIURIL) 25 MG tablet Take 25 mg by mouth daily.     LORazepam (ATIVAN) 0.5 MG tablet Take 0.5 mg by mouth at bedtime.     losartan (COZAAR) 100 MG tablet Take 100 mg by mouth daily.     MAGNESIUM PO Take 1 tablet by mouth daily. With vitamin d     NON FORMULARY Take 2  capsules by mouth as needed (for IBS ( triphala)).     OVER THE COUNTER MEDICATION Take 1 capsule by mouth at bedtime as needed (constipation). Cleanse More herbal laxative     milk thistle 175 MG tablet Take 175 mg by mouth daily. (Patient not taking: Reported on 06/14/2023)     Probiotic Product (PROBIOTIC BLEND PO) Take by mouth. (Patient not taking: Reported on 06/14/2023)     Turmeric (QC TUMERIC COMPLEX PO) Take by mouth. (Patient not taking: Reported on 06/14/2023)     No current facility-administered medications for this visit.    Allergies  Allergen Reactions   Penicillin G Swelling   Prolia [Denosumab] Other (See Comments)    Patient states she fainted   Sulfa Antibiotics Hives    Other Reaction: GI UPSET   Cheese     Congestion    Ciprofloxacin Swelling   Metronidazole Swelling   Milk-Related Compounds     Congestion    Penicillins Nausea Only    Has patient had a PCN reaction causing immediate rash, facial/tongue/throat swelling, SOB or lightheadedness with hypotension: no Has patient had a PCN reaction causing severe rash involving mucus membranes or skin necrosis: no Has patient had a PCN reaction that required hospitalization: no Has patient had a PCN reaction occurring within the last 10 years: no If all of the above answers are "NO", then may proceed with Cephalosporin use.     Review of Systems:  neg     Physical Exam:    BP 124/70   Pulse 72   Ht 5\' 3"  (1.6 m)   Wt 174 lb (78.9 kg)   BMI 30.82 kg/m  Wt Readings from Last 3 Encounters:  06/14/23 174 lb (78.9 kg)  04/16/23 173 lb (78.5 kg)  04/01/23 173 lb (78.5 kg)   Constitutional:  Well-developed, in no acute distress.  Psychiatric: Normal mood and affect. Behavior is normal. HEENT: Pupils normal.  Conjunctivae are normal. No scleral icterus. Neck supple.  Cardiovascular: Normal rate, regular rhythm. No edema Pulmonary/chest: Effort normal and breath sounds normal. No wheezing, rales or  rhonchi. Abdominal: Soft, nondistended. Nontender. Bowel sounds active throughout. There are no masses palpable. No hepatomegaly. Rectal: Deferred Neurological: Alert and oriented to person place and time. Skin: Skin is warm and dry. No rashes noted.  Data Reviewed: I have personally reviewed following labs and imaging studies  CBC:    Latest Ref Rng & Units 11/04/2022    9:23 AM 08/26/2017    3:48 AM 08/25/2017   11:43 AM  CBC  WBC 3.4 - 10.8 x10E3/uL 5.3  7.0    Hemoglobin 11.1 - 15.9 g/dL 29.5  28.4  13.2   Hematocrit 34.0 - 46.6 % 45.5  34.5  35.7   Platelets 150 - 450 x10E3/uL 215  166      CMP:    Latest Ref Rng & Units 11/04/2022    9:23 AM 05/14/2020   11:05 AM 08/26/2017    3:48 AM  CMP  Glucose 70 - 99 mg/dL 87  85  91   BUN 8 - 27 mg/dL 12  12  11    Creatinine 0.57 - 1.00 mg/dL 4.40  1.02  7.25   Sodium 134 - 144 mmol/L 139  139  136   Potassium 3.5 - 5.2 mmol/L 4.1  4.0  3.7   Chloride 96 - 106 mmol/L 99  104  104   CO2 20 - 29 mmol/L 27  24  26    Calcium 8.7 - 10.3 mg/dL 9.6  9.3  8.5   Total Protein 6.0 - 8.5 g/dL 6.6     Total Bilirubin 0.0 - 1.2 mg/dL 0.5     Alkaline Phos 44 - 121 IU/L 90     AST 0 - 40 IU/L 20     ALT 0 - 32 IU/L 11           Edman Circle, MD 06/14/2023, 9:04 AM  Cc: Hortencia Conradi, NP

## 2023-10-21 ENCOUNTER — Telehealth: Payer: Self-pay | Admitting: Gastroenterology

## 2023-10-21 NOTE — Progress Notes (Unsigned)
 10/22/2023 Maria Middleton [id number] 1951-03-22  Referring provider: Verdia Glad, NP Primary GI doctor: Dr. Venice Gillis ( [facility])  ASSESSMENT AND PLAN:   Irritable bowel syndrome with constipation Pelvic floor dysfunction 07/2015 colonoscopy unremarkable recall 2027- will need 2 day prep 10/2022 RUQ US  unremarkable 02/2023 CT abdomen pelvis [facility] Continue pelvic floor PT, has helped If she takes 2 of the linzess  72, she has diarrhea, but in the last month she has had to take stimulant/magnesium  without help, yesterday had AB pain with it - Recommend MiraLAX bowel purge. - Suggest daily Linzess  with added fiber - consider colonoscopy after she sees cardiology   Sharp substernal pain, with fatigue/weakness after eating worse the last 2 weeks, has had 10 lb weight loss in last year History of Gastric and duodenal ulcers s/p EGD 07/2022 H. pylori negative, moderate HH 03/2023 repeat EGD 4 cm hiatal hernia, normal stomach few healing erosions duodenum which showed villous blunting but nonspecific, subsequent serological testing negative for celiac 10/2022 RUQ US  unremarkable 01/14/2023 GES unremarkable 04/08/2023 HIDA EF 46% unremarkable CT AB and pelvis 02/2023 with no vascular issues seen Had elevated amylase 09/2023 at 64, normal lipase Sharp substernal pain postprandially with differential including esophageal spasm, chronic mesenteric ischemia, and cardiac etiology. Symptoms include palpitations and fatigue. - Order MRCP to evaluate pancreas and bile ducts. - Encouraged cardiology follow-up to rule out cardiac causes. - consider barium swallow   Right RCC S/p partial nephrectomy Released last year after 5 years at [facility] urology Sept last year had CT  Morbid obesity  Body mass index is 29.26 kg/m.  - continue with weight loss  Wt Readings from Last 10 Encounters:  10/22/23 165 lb 3.2 oz (74.9 kg)  06/14/23 174 lb (78.9 kg)  04/16/23  173 lb (78.5 kg)  04/01/23 173 lb (78.5 kg)  02/18/23 170 lb (77.1 kg)  02/16/23 176 lb 12.8 oz (80.2 kg)  01/14/23 172 lb (78 kg)  12/30/22 174 lb (78.9 kg)  12/16/22 175 lb (79.4 kg)  12/15/22 178 lb (80.7 kg)    Patient Care Team: Verdia Glad, NP as PCP - General Arty Binning, MD (Inactive) as PCP - Cardiology (Cardiology)  HISTORY OF PRESENT ILLNESS: 73 y.o. female with a past medical history of hypertension, depression, GERD, constipation, diverticulosis, status post hysterectomy, right renal cell carcinoma 2018 s/p left partial nephrectomy with Dr. Dulcy Gibney and others listed below presents for follow up, [facility]  08/20/2015 colonoscopy no polyps removed recall 10 years. She has had constipation and IBS-C, can not tolerate linzess .  She would take magnesium  and trifelo an herbel blend at night but this did not work well so she will take stimulant.  She continues to follow with Dr. Dulcy Gibney, she had recent CT with and without in Sept  2023 there.  07/2022 EGD with Dr. Venice Gillis for GERD/nausea showed moderate hiatal, non bleeding gastric ulcers, duodenal erosions without bleeding. Negative H pylori gastric, negative celiac.  10/13/2022 OV with myself continue nextium twice daily for 8 weeks, then once a day, states had CT AB and pelvis with [facility] in sept 2023, getting repeat 02/2023, need records 10/29/22 RUQ US  unremarkable 03/2023 repeat EGD did show some nonspecific villous blunting in the duodenum non-H. pylori gastritis, serological testing for celiac negative.  Patient presents for nausea and yellow stools. Discussed the use of AI scribe software for clinical note transcription with the patient, who gave verbal consent to proceed.  History of Present Illness   Maria Middleton is a 73 year old female with a history of duodenal ulcers and hiatal hernia who presents with worsening constipation and abdominal pain.  She has a history of  duodenal ulcers and a hiatal hernia. An EGD in February was H. pylori negative, and a repeat endoscopy in October showed healed erosions but persistent hiatal hernia. A gastric emptying study and a HIDA scan were normal. An ultrasound of the liver and gallbladder in May 2024 showed no abnormalities.  She experiences significant constipation, requiring magnesium  and Linzess  72 mcg daily. Despite this, she often needs additional stimulants like Dulcolax, which sometimes do not work. Increasing the Linzess  dose results in diarrhea. Recently, her constipation has worsened, accompanied by abdominal pain.  She describes sharp substernal pain after eating, radiating left and upwards, but not into the neck or shoulder. This pain is not associated with exertion and occurs with any food intake. She has been feeling unwell for the past two weeks, with weakness and fatigue after eating, necessitating rest. She reports an eight-pound weight loss over the last month due to inability to eat because of pain and sickness.  She has a history of kidney cancer surgery and was released from urology follow-up last year after five years of surveillance. Her last CT scan was in October of the previous year. She recently underwent extensive blood work due to concerns about her pancreas, which was reported as normal, although an alternative health practitioner suggested she was not breaking down nutrients properly.  She experiences back pain under her shoulder blades, which makes breathing difficult, and has palpitations. She has not been able to exercise much recently [facility] No swelling in her legs but reports shortness of breath with exertion. Her heart doctor retired last year, and she has not yet established care with a new cardiologist.       Wt Readings from Last 6 Encounters:  10/22/23 165 lb 3.2 oz (74.9 kg)  06/14/23 174 lb (78.9 kg)  04/16/23 173 lb (78.5 kg)  04/01/23 173 lb (78.5 kg)   02/18/23 170 lb (77.1 kg)  02/16/23 176 lb 12.8 oz (80.2 kg)     She  reports that she has never smoked. She has never been exposed to tobacco smoke. She has never used smokeless tobacco. She reports current alcohol use. She reports that she does not use drugs.  Current Medications:    Current Outpatient Medications (Cardiovascular):    hydrochlorothiazide (HYDRODIURIL) 25 MG tablet, Take 25 mg by mouth daily.   losartan (COZAAR) 100 MG tablet, Take 100 mg by mouth daily.   EPINEPHrine  0.3 mg/0.3 mL IJ SOAJ injection, Use as directed for life threatening allergic reactions (Patient not taking: Reported on 10/22/2023)  Current Outpatient Medications (Respiratory):    fexofenadine  (ALLEGRA ) 180 MG tablet, Take 1 tablet (180 mg total) by mouth daily. (Patient taking differently: Take 180 mg by mouth as needed. As needed)   fluticasone (FLONASE) 50 MCG/ACT nasal spray, Place 1 spray into both nostrils daily.   Current Outpatient Medications (Hematological):    Cyanocobalamin (B-12) 50 MCG TABS, Take by mouth. (Patient not taking: Reported on 10/22/2023)  Current Outpatient Medications (Other):    Ascorbic Acid (VITAMIN C) POWD, Take 1 Dose by mouth daily as needed (stress).   cholecalciferol (VITAMIN D3) 25 MCG (1000 UNIT) tablet, Take 1,000 Units by mouth daily.   esomeprazole  (NEXIUM ) 40 MG capsule, Take 40 mg by mouth daily.  linaclotide  (LINZESS ) 72 MCG capsule, Take 1 capsule (72 mcg total) by mouth daily before breakfast. (Patient taking differently: Take 72 mcg by mouth as needed.)   LORazepam  (ATIVAN ) 0.5 MG tablet, Take 0.5 mg by mouth at bedtime.   MAGNESIUM  PO, Take 1 tablet by mouth daily. With vitamin d    NON FORMULARY, Take 2 capsules by mouth as needed (for IBS ( triphala)).   OVER THE COUNTER MEDICATION, Take 1 capsule by mouth at bedtime as needed (constipation). Cleanse More herbal laxative   milk thistle 175 MG tablet, Take 175 mg by mouth daily. (Patient not taking:  Reported on 10/22/2023)   Probiotic Product (PROBIOTIC BLEND PO), Take by mouth. (Patient not taking: Reported on 10/22/2023)   Turmeric (QC TUMERIC COMPLEX PO), Take by mouth. 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    BMI 30.0-30.9,adult    Chest pain    Constipation    Cytomegaloviral disease (HCC)    Depression    Diarrhea    Dyslipidemia    Dysrhythmia    Hx LBBB   Edema of both lower extremities    Epstein Barr virus infection    Fibromyalgia    Headache    migraines   Hiatal hernia    Hormonal disorder    Hypertension    Hypertensive heart disease without congestive heart failure    Hypothyroidism, unspecified    Insomnia    Joint pain    Left shoulder pain    Lower abdominal pain    Malaise and fatigue    Nausea & vomiting    Neck pain    Night terrors    Nightmare disorder    Osteoporosis    Otogenic pain    Pain in shoulder region, right    Palpitations    Panic attack    Pneumonia    4 years ago   Renal cell carcinoma of left kidney (HCC)    Renal mass 08/25/2017   Scoliosis    Sinus problem    Sleep apnea    SOB (shortness of breath)    SOB (shortness of breath)    Stomach pain    Stomach ulcer    Vaginal discharge    Vitamin D  deficiency    Allergies:  Allergies  Allergen Reactions   Penicillin G Swelling   Prolia [Denosumab] Other (See Comments)    Patient states she fainted   Sulfa Antibiotics Hives    Other Reaction: GI UPSET   Alendronate Sodium    Cheese     Congestion    Ciprofloxacin Swelling   Metronidazole Swelling   Milk-Related Compounds     Congestion    Penicillins Nausea Only    Has patient had a PCN reaction causing immediate rash, facial/tongue/throat swelling, SOB or lightheadedness  with hypotension: no Has patient had a PCN reaction causing severe rash involving mucus membranes or skin necrosis: no Has patient had a PCN reaction that required hospitalization: no Has patient had a PCN reaction occurring within the last 10 years: no If all of the above answers are "NO", then may proceed with Cephalosporin use.      Surgical History:  She  has a past surgical history that includes Abdominal  hysterectomy; scharnoma; Tubal ligation; Dilation and curettage of uterus; Ankle surgery; Robotic assited partial nephrectomy (Left, 08/25/2017); and Upper gastrointestinal endoscopy. Family History:  Her family history includes Alcoholism in her mother; Diabetes in her mother; Emphysema in her brother; Heart disease in her father and mother; Hypertension in her father and mother; Leukemia in her brother; Stroke in her father and mother.  REVIEW OF SYSTEMS  : All other systems reviewed and negative except where noted in the History of Present Illness.  PHYSICAL EXAM: BP 128/84 (Cuff Size: Normal)   Pulse 70   Ht 5\' 3"  (1.6 m)   Wt 165 lb 3.2 oz (74.9 kg)   BMI 29.26 kg/m  General:   Pleasant, well developed female in no acute distress Head:   Normocephalic and atraumatic. Eyes:  sclerae anicteric,conjunctive pink  Heart:   regular rate and rhythm Pulm:  Clear anteriorly; no wheezing Abdomen:   Soft, Obese AB, Active bowel sounds. mild tenderness in the epigastrium and in the LLQ. Without guarding and Without rebound, No organomegaly appreciated. Rectal: Not evaluated Extremities:  Without edema. Msk: Symmetrical without gross deformities. Peripheral pulses intact.  Neurologic:  Alert and  oriented x4;  No focal deficits.  Skin:   Dry and intact without significant lesions or rashes. Psychiatric:  Cooperative. Normal mood and affect.  RELEVANT LABS AND IMAGING: CBC    Component Value Date/Time   WBC 5.3 11/04/2022 0923   WBC 7.0 08/26/2017 0348   RBC 4.98 11/04/2022 0923    RBC 3.74 (L) 08/26/2017 0348   HGB 15.3 11/04/2022 0923   HCT 45.5 11/04/2022 0923   PLT 215 11/04/2022 0923   MCV 91 11/04/2022 0923   MCH 30.7 11/04/2022 0923   MCH 31.3 08/26/2017 0348   MCHC 33.6 11/04/2022 0923   MCHC 33.9 08/26/2017 0348   RDW 13.2 11/04/2022 0923   LYMPHSABS 1.3 11/04/2022 0923   EOSABS 0.1 11/04/2022 0923   BASOSABS 0.0 11/04/2022 0923    CMP     Component Value Date/Time   NA 139 11/04/2022 0923   K 4.1 11/04/2022 0923   CL 99 11/04/2022 0923   CO2 27 11/04/2022 0923   GLUCOSE 87 11/04/2022 0923   GLUCOSE 91 08/26/2017 0348   BUN 12 11/04/2022 0923   CREATININE 1.06 (H) 11/04/2022 0923   CALCIUM 9.6 11/04/2022 0923   PROT 6.6 11/04/2022 0923   ALBUMIN  4.1 11/04/2022 0923   AST 20 11/04/2022 0923   ALT 11 11/04/2022 0923   ALKPHOS 90 11/04/2022 0923   BILITOT 0.5 11/04/2022 0923   GFRNONAA 50 (L) 05/14/2020 1105   GFRAA 58 (L) 05/14/2020 1105     Edmonia Gottron, PA-C 10:47 AM

## 2023-10-21 NOTE — Telephone Encounter (Signed)
 Returned patient call & she has complaints of nausea w/o vomiting, yellow stools, and weakness intermittently for the last few months. BM's daily, takes linzess  & miralax PRN. She was last seen here 05/2023 for OV with Dr. Venice Gillis & was recommended to have a colon. Pt believes it has something to do with her gallbladder. Reassured her that RUQ US , HIDA, and gastric emptying scan done last year all were normal. Pt would like an OV to discuss further. Scheduled her for tomorrow at 9:20 am with [place], [place].

## 2023-10-21 NOTE — Telephone Encounter (Signed)
 Patient called and stated that she is having a lot of trouble eating and keeping her food down. Patient stated that she feels like she needs to get her gall bladder function test. Patient was advise of available appointments in order for her to be seen and she does not want to wait until July to be seen by Dr. Venice Gillis, and does not want to want until may to be seen by one of the PA'S. Patient is requesting a call back. Please advise.

## 2023-10-22 ENCOUNTER — Ambulatory Visit: Admitting: Physician Assistant

## 2023-10-22 ENCOUNTER — Encounter: Payer: Self-pay | Admitting: Physician Assistant

## 2023-10-22 ENCOUNTER — Other Ambulatory Visit

## 2023-10-22 VITALS — BP 128/84 | HR 70 | Ht 63.0 in | Wt 165.2 lb

## 2023-10-22 DIAGNOSIS — Z6829 Body mass index (BMI) 29.0-29.9, adult: Secondary | ICD-10-CM

## 2023-10-22 DIAGNOSIS — M6289 Other specified disorders of muscle: Secondary | ICD-10-CM | POA: Diagnosis not present

## 2023-10-22 DIAGNOSIS — R1013 Epigastric pain: Secondary | ICD-10-CM

## 2023-10-22 DIAGNOSIS — R748 Abnormal levels of other serum enzymes: Secondary | ICD-10-CM | POA: Diagnosis not present

## 2023-10-22 DIAGNOSIS — Z85528 Personal history of other malignant neoplasm of kidney: Secondary | ICD-10-CM

## 2023-10-22 DIAGNOSIS — K581 Irritable bowel syndrome with constipation: Secondary | ICD-10-CM

## 2023-10-22 DIAGNOSIS — K219 Gastro-esophageal reflux disease without esophagitis: Secondary | ICD-10-CM

## 2023-10-22 DIAGNOSIS — R634 Abnormal weight loss: Secondary | ICD-10-CM | POA: Diagnosis not present

## 2023-10-22 DIAGNOSIS — K859 Acute pancreatitis without necrosis or infection, unspecified: Secondary | ICD-10-CM

## 2023-10-22 LAB — CBC WITH DIFFERENTIAL/PLATELET
Basophils Absolute: 0 10*3/uL (ref 0.0–0.1)
Basophils Relative: 0.5 % (ref 0.0–3.0)
Eosinophils Absolute: 0 10*3/uL (ref 0.0–0.7)
Eosinophils Relative: 0.3 % (ref 0.0–5.0)
HCT: 45.8 % (ref 36.0–46.0)
Hemoglobin: 15.3 g/dL — ABNORMAL HIGH (ref 12.0–15.0)
Lymphocytes Relative: 24.1 % (ref 12.0–46.0)
Lymphs Abs: 1.1 10*3/uL (ref 0.7–4.0)
MCHC: 33.4 g/dL (ref 30.0–36.0)
MCV: 92.9 fl (ref 78.0–100.0)
Monocytes Absolute: 0.4 10*3/uL (ref 0.1–1.0)
Monocytes Relative: 9.2 % (ref 3.0–12.0)
Neutro Abs: 3.1 10*3/uL (ref 1.4–7.7)
Neutrophils Relative %: 65.9 % (ref 43.0–77.0)
Platelets: 191 10*3/uL (ref 150.0–400.0)
RBC: 4.93 Mil/uL (ref 3.87–5.11)
RDW: 14 % (ref 11.5–15.5)
WBC: 4.7 10*3/uL (ref 4.0–10.5)

## 2023-10-22 LAB — LIPASE: Lipase: 12 U/L (ref 11.0–59.0)

## 2023-10-22 LAB — COMPREHENSIVE METABOLIC PANEL WITH GFR
ALT: 8 U/L (ref 0–35)
AST: 17 U/L (ref 0–37)
Albumin: 4.1 g/dL (ref 3.5–5.2)
Alkaline Phosphatase: 57 U/L (ref 39–117)
BUN: 13 mg/dL (ref 6–23)
CO2: 32 meq/L (ref 19–32)
Calcium: 9.5 mg/dL (ref 8.4–10.5)
Chloride: 98 meq/L (ref 96–112)
Creatinine, Ser: 1.29 mg/dL — ABNORMAL HIGH (ref 0.40–1.20)
GFR: 40.99 mL/min — ABNORMAL LOW (ref >60.00)
Glucose, Bld: 86 mg/dL (ref 70–99)
Potassium: 3.8 meq/L (ref 3.5–5.1)
Sodium: 138 meq/L (ref 135–145)
Total Bilirubin: 0.9 mg/dL (ref 0.2–1.2)
Total Protein: 6.8 g/dL (ref 6.0–8.3)

## 2023-10-22 LAB — AMYLASE: Amylase: 35 U/L (ref 27–131)

## 2023-10-22 LAB — SEDIMENTATION RATE: Sed Rate: 13 mm/h (ref 0–30)

## 2023-10-22 NOTE — Patient Instructions (Signed)
 Your provider has requested that you go to the basement level for lab work before leaving today. Press "B" on the elevator. The lab is located at the first door on the left as you exit the elevator.  You have been scheduled for an MRI/MRCP Abdomen at [facility] on 10/26/23. Your appointment time is 4 pm. Please arrive to admitting (at main entrance of the hospital) 30 minutes prior to your appointment time for registration purposes. Please make certain not to have anything to eat or drink 4 hours prior to your test. In addition, if you have any metal in your body, have a pacemaker or defibrillator, please be sure to let your ordering physician know. This test typically takes 45 minutes to 1 hour to complete. Should you need to reschedule, please call [phone] to do so.  Please do the following: Purchase a bottle of Miralax over the counter as well as a box of 5 mg dulcolax tablets. Take 4 dulcolax tablets. Wait 1 hour. You will then drink 6-8 capfuls of Miralax mixed in an adequate amount of water /juice/gatorade (you may choose which of these liquids to drink) over the next 2-3 hours. You should expect results within 1 to 6 hours after completing the bowel purge. Go to the er if you have severe AB pain, can not pass gas or stool in over 12 hours, can not hold down any food.   Linzess  start daily after the miralax bowel purge *IBS-C patients may begin to experience relief from belly pain and overall abdominal symptoms (pain, discomfort, and bloating) in about 1 week,  with symptoms typically improving over 12 weeks.  Take at least 30 minutes before the first meal of the day on an empty stomach You can have a loose stool if you eat a high-fat breakfast. Give it at least 7 days, may have more bowel movements during that time.   The diarrhea should go away and you should start having normal, complete, full bowel movements.  It may be helpful to start treatment when you can be near the  comfort of your own bathroom, such as a weekend.  After you are out we can send in a prescription if you did well, there is a prescription card  Miralax is an osmotic laxative.  It only brings more water  into the stool.  This is safe to take daily.  Can take up to 17 gram of miralax twice a day.  Mix with juice or coffee.  Start 1 capful at night for 3-4 days and reassess your response in 3-4 days.  You can increase and decrease the dose based on your response.  Remember, it can take up to 3-4 days to take effect OR for the effects to wear off.   I often pair this with benefiber in the morning to help assure the stool is not too loose.

## 2023-10-25 ENCOUNTER — Telehealth: Payer: Self-pay | Admitting: Physician Assistant

## 2023-10-25 NOTE — Telephone Encounter (Signed)
 Patient called and stated that she is still having abdominal pain. Patient stated that she would like to know if she is still required to take her omeprazole. Patient also stated she would like to know how she should be eating with Acute pancreatitis. Please advise.

## 2023-10-25 NOTE — Telephone Encounter (Signed)
 Left message on vm for patient to return call.   Patient is scheduled for recommended MRI tomorrow, we will be able to provide more information once Maria Middleton receives the radiology report. Patient needs to continue Esomeprazole  40 mg daily. Typical diet is high in protein and low in fat. Can start off with clear liquids and gradually introducing bland, low-fat solids. Patient should be avoiding fatty foods like fried foods, butter, full-fat dairy, and high-fat meats. No alcohol.

## 2023-10-26 ENCOUNTER — Ambulatory Visit (HOSPITAL_COMMUNITY)
Admission: RE | Admit: 2023-10-26 | Discharge: 2023-10-26 | Disposition: A | Discharge status: Home / Self Care | Source: Ambulatory Visit | Attending: Physician Assistant | Admitting: Physician Assistant

## 2023-10-26 ENCOUNTER — Other Ambulatory Visit: Payer: Self-pay | Admitting: Physician Assistant

## 2023-10-26 DIAGNOSIS — K858 Other acute pancreatitis without necrosis or infection: Secondary | ICD-10-CM | POA: Diagnosis present

## 2023-10-26 DIAGNOSIS — K859 Acute pancreatitis without necrosis or infection, unspecified: Secondary | ICD-10-CM | POA: Insufficient documentation

## 2023-10-26 MED ORDER — GADOBUTROL 1 MMOL/ML IV SOLN
7.0000 mL | Freq: Once | INTRAVENOUS | Status: AC | PRN
  Administered 2023-10-26: 7 mL via INTRAVENOUS

## 2023-10-26 NOTE — Telephone Encounter (Signed)
 Called and spoke with patient regarding Amanda's recommendations. Pt stated that she can't drink a lot because she has a hiatal hernia, she only takes sips. I told pt to do the best she can. Patient verbalized understanding.

## 2023-10-26 NOTE — Telephone Encounter (Signed)
 I spoke with patient this morning. Patient is concerned about her creatinine level of 1.29 due to hx of kidney cancer. Patient states that in the past her provider told her to work on hydration and rechecked labs prior to any imaging. I informed patient that the radiology department does have a protocol and guidelines to follow regarding kidney function. I told pt that I would get your recommendations. Patient is really hesitant to proceed with current kidney function result. Please advise, thanks

## 2023-10-26 NOTE — Telephone Encounter (Signed)
 Inbound call from patient, states she is unsure if she should proceed with MRI being that her kidney function is elevated. Advised patient of recommendations below, however patient is adament on speaking to a nurse in regards to kidney function and "unnecessary radiation"

## 2023-10-27 ENCOUNTER — Telehealth: Payer: Self-pay

## 2023-10-27 NOTE — Telephone Encounter (Signed)
 Called and reviewed recommendations with patient. Patient was previously taking Benefiber 1 packet daily, but has since stopped. Patient reports that any form of fiber makes her constipation worse. We reviewed ER precautions. Any other recommendations?

## 2023-10-27 NOTE — Telephone Encounter (Signed)
 Received a call from patient stating that she has been having diarrhea since she got home from the MRI yesterday evening. Patient states that she had not had a bowel movement for several days prior, has been taking Linzess  and she added Miralax. Pt states that she did not eat anything solid all day yesterday until after her MRI and that was fruit (kiwi, bananas, and coconut water ). I told patient that it is likely that her bowels just started passing everything that she has not been able to evacuate. Pt reports 6 episodes of diarrhea yesterday evening. Patient felt like having a BM this morning and passed a little bit of diarrhea. Patient also reports that her lips are itchy and swollen. Patient states that she felt it yesterday when she got home and took an Allegra . Symptoms are still about the same. Patient denies any SOB or tongue swelling. Not sure if related to contrast or not. Patient states that she did not pay much attention to it at the time of the MRI. Please advise, thanks.

## 2023-10-28 NOTE — Telephone Encounter (Signed)
 Called and spoke with patient regarding recommendations. Patient's appointment has been rescheduled to Dr. Hobert Lull schedule on 12/15/23 and 8:50 am. I discussed patient's continued concerns in detail, we discussed current bowel regimen. Patient states that her stomach is sensitive to everything, she will try to work on working up to [employer] [facility] [employer] BID. We reviewed MRI results, patient states that she will try her best. I answered all of patient's questions. Patient verbalized understanding and had no concerns at the end of the call.

## 2023-12-14 ENCOUNTER — Telehealth: Payer: Self-pay | Admitting: Gastroenterology

## 2023-12-14 NOTE — Telephone Encounter (Signed)
 Patient called and stated that she has injured her leg and whenever she stands up her leg well swell up all over again. Patient is requesting to keep her appointment for tomorrow but change it to a tele health visit. Patient is requesting a call back. Please advise.

## 2023-12-14 NOTE — Telephone Encounter (Signed)
 Please see note below and advise

## 2023-12-14 NOTE — Telephone Encounter (Signed)
 Spoke with pt. Pt decided to reschedule due to not being able to come in for the office visit. Pt to be scheduled to see Dr. Venice Gillis on 12/28/2023  at 11:00 AM. 7 day hold. Pt made aware.  Pt verbalized understanding with all questions answered.

## 2023-12-15 ENCOUNTER — Ambulatory Visit: Admitting: Gastroenterology

## 2023-12-15 DIAGNOSIS — M25562 Pain in left knee: Secondary | ICD-10-CM | POA: Insufficient documentation

## 2023-12-16 NOTE — Telephone Encounter (Signed)
 Patient called and stated that she would like to do her Virtual appointment on July 1 st at 2:30 PM. Please advise.

## 2023-12-16 NOTE — Telephone Encounter (Signed)
 Pt called in requesting to be scheduled at a later time on 12/28/23. Currently it is a 7 day hold & she's aware Landon Pinion will contact her on 12/21/23 to book the spot. Will send Landon Pinion a note to see if 2:30 is a possible time for when he returns to office.

## 2023-12-21 NOTE — Telephone Encounter (Signed)
 Pt was made aware of reminder that was received in Epic.  Pt was scheduled to have an in person office visit with Dr. Charlanne on 12/28/2023 at 2:30 PM.  Pt made aware. Pt verbalized understanding with all questions answered.

## 2023-12-22 ENCOUNTER — Ambulatory Visit: Admitting: Physician Assistant

## 2023-12-28 ENCOUNTER — Ambulatory Visit: Admitting: Gastroenterology

## 2023-12-28 ENCOUNTER — Encounter: Payer: Self-pay | Admitting: Gastroenterology

## 2023-12-28 VITALS — BP 120/80 | HR 98 | Ht 63.0 in | Wt 161.0 lb

## 2023-12-28 DIAGNOSIS — K581 Irritable bowel syndrome with constipation: Secondary | ICD-10-CM

## 2023-12-28 DIAGNOSIS — M9905 Segmental and somatic dysfunction of pelvic region: Secondary | ICD-10-CM

## 2023-12-28 DIAGNOSIS — R109 Unspecified abdominal pain: Secondary | ICD-10-CM | POA: Diagnosis not present

## 2023-12-28 DIAGNOSIS — G8929 Other chronic pain: Secondary | ICD-10-CM

## 2023-12-28 DIAGNOSIS — K219 Gastro-esophageal reflux disease without esophagitis: Secondary | ICD-10-CM

## 2023-12-28 DIAGNOSIS — R140 Abdominal distension (gaseous): Secondary | ICD-10-CM

## 2023-12-28 DIAGNOSIS — K449 Diaphragmatic hernia without obstruction or gangrene: Secondary | ICD-10-CM

## 2023-12-28 DIAGNOSIS — D3610 Benign neoplasm of peripheral nerves and autonomic nervous system, unspecified: Secondary | ICD-10-CM

## 2023-12-28 MED ORDER — ESOMEPRAZOLE MAGNESIUM 40 MG PO CPDR: 40.0000 mg | DELAYED_RELEASE_CAPSULE | Freq: Every day | ORAL | 4 refills | Status: AC

## 2023-12-28 NOTE — Patient Instructions (Addendum)
 _______________________________________________________  If your blood pressure at your visit was 140/90 or greater, please contact your primary care physician to follow up on this.  _______________________________________________________  If you are age 74 or older, your body mass index should be between 23-30. Your Body mass index is 28.52 kg/m. If this is out of the aforementioned range listed, please consider follow up with your Primary Care Provider.  If you are age 69 or younger, your body mass index should be between 19-25. Your Body mass index is 28.52 kg/m. If this is out of the aformentioned range listed, please consider follow up with your Primary Care Provider.   ________________________________________________________  The [facility] providers would like to encourage you to use MYCHART to communicate with providers for non-urgent requests or questions.  Due to long hold times on the telephone, sending your provider a message by [facility] may be a faster and more efficient way to get a response.  Please allow 48 business hours for a response.  Please remember that this is for non-urgent requests.  _______________________________________________________  Your provider has requested that you go to the basement level for lab work before leaving today. Press B on the elevator. The lab is located at the first door on the left as you exit the elevator.  We have sent the following medications to your pharmacy for you to pick up at your convenience: Nexium   Thank you,  Dr. Lynnie Bring

## 2023-12-28 NOTE — Progress Notes (Signed)
 Chief Complaint: FU  Referring Provider:  Zachary Lamar BRAVO, NP      ASSESSMENT AND PLAN;   #1.  Chronic Abdo pain. Neg CT AP 03/2023 with contrast, US , GES, EGD as below. Neg celiac screen. Neg MRI except for   #2. IBS-C with pelvic floor dysfunction.  Had diarrhea with Linzess .  Now stopped working.  #3. GERD with HH  #4. 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 No need for any intervention per [facility].   Plan: -Resume nexium  40mg  po every day #90, 4RF -Increased Miralax 17g po BID -If still problems, Colon with 2 day prep (d/t continued symptoms) -Stool for fecal elastace (pt had MRI showing pancreatic atrophy, very concerned about EPI) -Resume pelvic PT.   HPI:    Maria Middleton is a 73 y.o. female  With multiple medical problems as listed below including right RCC S/p partial nephrectomy.   Discussed the use of AI scribe software for clinical note transcription with the patient, who gave verbal consent to proceed.  History of Present Illness Maria Middleton is a 73 year old female with hiatal hernia and IBS who presents with persistent indigestion and gastrointestinal issues.  She experiences persistent indigestion, described as 'bad' and located in the upper abdomen. She has a known hiatal hernia and previously took Nexium , which provided partial relief. She discontinued Nexium  two months ago due to illness and lack of appetite, leading to regurgitation and esophageal irritation.  She has experienced significant weight loss, losing 15 pounds due to stomach pain and cramping, reducing her weight to 158 pounds. She is working with a Museum/gallery exhibitions officer and is trying sourdough toast to prevent further weight loss.  She experiences constipation unless she takes medication. Linzess , previously effective, has stopped working. She uses Miralax in her coffee, resulting in watery stools. She experiments with her diet, noting that avoiding food after 4 PM  sometimes helps her bowel movements, though inconsistently.  She reports oily stools and has not had a fully formed stool. A previous workup showed elevated amylase levels, which later normalized. She has difficulty digesting fruit and dairy, causing stomach pain.  She has a history of IBS, causing stomach spasms and alternating constipation and diarrhea. She cannot tolerate fiber, which exacerbates her symptoms.  She has osteoporosis and has had adverse reactions to medications like Prolia and Fosamax. She cannot consume dairy due to stomach issues, complicating her osteoporosis management.  She was scheduled for colonoscopy previously but patient canceled.    Wt Readings from Last 3 Encounters:  12/28/23 161 lb (73 kg)  10/22/23 165 lb 3.2 oz (74.9 kg)  06/14/23 174 lb (78.9 kg)     Past GI procedures:  MRI 10/26/2023 IMPRESSION: 1. No MR findings to explain pain or symptoms. 2. Unchanged, mild, diffuse atrophy of the pancreas. No pancreatic ductal dilatation. No acute inflammatory findings. 3. Unchanged enhancing right retroperitoneal mass underlying the pancreatic head, duodenum, and inferior vena cava measuring 5.1 x 3.7 cm. This has been previously biopsied. 4. Unchanged postoperative appearance of the inferior pole of the left kidney status post partial nephrectomy. No suspicious soft tissue or contrast enhancement to suggest local recurrence.   HIDA with EF 03/2023 IMPRESSION: 1.  Patent cystic and common bile ducts. 2.  Normal gallbladder ejection fraction.  EGD 03/2023 - 4 cm hiatal hernia. - Normal stomach. Bx- neg HP. - Few healing erosions in duodenum. Bx- Mild villous blunting - Negative celiac serology.  CT AP with contrast  03/2023 -Stable chronic retrocaval mass -Stable appearance of left kidney status post partial nephrectomy.  No evidence of metastatic disease. -Prominent stool throughout the colon consistent with constipation.  08/20/2015 colonoscopy  no polyps removed recall 10 years. She has had constipation and IBS-C, can not tolerate linzess .   07/2022 EGD with Dr. Charlanne for GERD/nausea showed moderate hiatal, non bleeding gastric ulcers, duodenal erosions without bleeding. Negative H pylori gastric, negative celiac.   10/29/22 RUQ US  unremarkable  GES 12/2022: neg  Past Medical History:  Diagnosis Date   Abdominal bloating    Allergic rhinitis    Allergic to food    Anemia, unspecified    Back pain    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    BMI 30.0-30.9,adult    Chest pain    Constipation    Cytomegaloviral disease (HCC)    Depression    Diarrhea    Dyslipidemia    Dysrhythmia    Hx LBBB   Edema of both lower extremities    Epstein Barr virus infection    Fibromyalgia    Headache    migraines   Hiatal hernia    Hormonal disorder    Hypertension    Hypertensive heart disease without congestive heart failure    Hypothyroidism, unspecified    Insomnia    Joint pain    Left shoulder pain    Lower abdominal pain    Malaise and fatigue    Nausea & vomiting    Neck pain    Night terrors    Nightmare disorder    Osteoporosis    Otogenic pain    Pain in shoulder region, right    Palpitations    Panic attack    Pneumonia    4 years ago   Renal cell carcinoma of left kidney (HCC)    Renal mass 08/25/2017   Scoliosis    Sinus problem    Sleep apnea    SOB (shortness of breath)    SOB (shortness of breath)    Stomach pain    Stomach ulcer    Vaginal discharge    Vitamin D  deficiency     Past Surgical History:  Procedure Laterality Date   ABDOMINAL HYSTERECTOMY     one ovary left   ANKLE SURGERY     right ankle surgery   DILATION AND CURETTAGE OF UTERUS     ROBOTIC ASSITED PARTIAL NEPHRECTOMY Left 08/25/2017   Procedure: XI ROBOTIC ASSITED LEFT PARTIAL NEPHRECTOMY;  Surgeon: Cam Morene ORN, MD;  Location: [facility];  Service: Urology;  Laterality: Left;    scharnoma     biopsy done to right abdomen   TUBAL LIGATION     UPPER GASTROINTESTINAL ENDOSCOPY      Family History  Problem Relation Age of Onset   Hypertension Mother    Stroke Mother    Diabetes Mother    Heart disease Mother    Alcoholism Mother    Stroke Father    Hypertension Father    Heart disease Father    Emphysema Brother    Leukemia Brother    Colon cancer Neg Hx    Esophageal cancer Neg Hx    Rectal cancer Neg Hx    Stomach cancer Neg Hx     Social History   Tobacco Use   Smoking status: Never    Passive exposure: Never   Smokeless tobacco: Never  Vaping Use   Vaping status: Never Used  Substance  Use Topics   Alcohol use: Yes    Comment: socially   Drug use: No    Current Outpatient Medications  Medication Sig Dispense Refill   Ascorbic Acid (VITAMIN C) POWD Take 1 Dose by mouth daily as needed (stress).     cholecalciferol (VITAMIN D3) 25 MCG (1000 UNIT) tablet Take 1,000 Units by mouth daily.     Cyanocobalamin (B-12) 50 MCG TABS Take by mouth. (Patient not taking: Reported on 10/22/2023)     EPINEPHrine  0.3 mg/0.3 mL IJ SOAJ injection Use as directed for life threatening allergic reactions (Patient not taking: Reported on 10/22/2023) 2 each 3   esomeprazole  (NEXIUM ) 40 MG capsule Take 40 mg by mouth daily.     fexofenadine  (ALLEGRA ) 180 MG tablet Take 1 tablet (180 mg total) by mouth daily. (Patient taking differently: Take 180 mg by mouth as needed. As needed) 30 tablet 5   fluticasone (FLONASE) 50 MCG/ACT nasal spray Place 1 spray into both nostrils daily.     hydrochlorothiazide (HYDRODIURIL) 25 MG tablet Take 25 mg by mouth daily.     linaclotide  (LINZESS ) 72 MCG capsule Take 1 capsule (72 mcg total) by mouth daily before breakfast. (Patient taking differently: Take 72 mcg by mouth as needed.) 90 capsule 3   LORazepam  (ATIVAN ) 0.5 MG tablet Take 0.5 mg by mouth at bedtime.     losartan (COZAAR) 100 MG tablet Take 100 mg by mouth daily.      MAGNESIUM  PO Take 1 tablet by mouth daily. With vitamin d      milk thistle 175 MG tablet Take 175 mg by mouth daily. (Patient not taking: Reported on 10/22/2023)     NON FORMULARY Take 2 capsules by mouth as needed (for IBS ( triphala)).     OVER THE COUNTER MEDICATION Take 1 capsule by mouth at bedtime as needed (constipation). Cleanse More herbal laxative     Probiotic Product (PROBIOTIC BLEND PO) Take by mouth. (Patient not taking: Reported on 10/22/2023)     Turmeric (QC TUMERIC COMPLEX PO) Take by mouth. (Patient not taking: Reported on 10/22/2023)     No current facility-administered medications for this visit.    Allergies  Allergen Reactions   Penicillin G Swelling   Prolia [Denosumab] Other (See Comments)    Patient states she fainted   Sulfa Antibiotics Hives    Other Reaction: GI UPSET   Alendronate Sodium    Cheese     Congestion    Ciprofloxacin Swelling   Metronidazole Swelling   Milk-Related Compounds     Congestion    Penicillins Nausea Only    Has patient had a PCN reaction causing immediate rash, facial/tongue/throat swelling, SOB or lightheadedness with hypotension: no Has patient had a PCN reaction causing severe rash involving mucus membranes or skin necrosis: no Has patient had a PCN reaction that required hospitalization: no Has patient had a PCN reaction occurring within the last 10 years: no If all of the above answers are NO, then may proceed with Cephalosporin use.     Review of Systems:  neg     Physical Exam:    BP 120/80   Pulse 98   Ht 5' 3 (1.6 m)   Wt 161 lb (73 kg)   BMI 28.52 kg/m  Wt Readings from Last 3 Encounters:  12/28/23 161 lb (73 kg)  10/22/23 165 lb 3.2 oz (74.9 kg)  06/14/23 174 lb (78.9 kg)   Constitutional:  Well-developed, in no acute distress. Psychiatric: Normal mood and  affect. Behavior is normal. HEENT: Pupils normal.  Conjunctivae are normal. No scleral icterus. Neck supple.  Cardiovascular: Normal rate,  regular rhythm. No edema Pulmonary/chest: Effort normal and breath sounds normal. No wheezing, rales or rhonchi. Abdominal: Soft, nondistended. Nontender. Bowel sounds active throughout. There are no masses palpable. No hepatomegaly. Rectal: Deferred Neurological: Alert and oriented to person place and time. Skin: Skin is warm and dry. No rashes noted.  Data Reviewed: I have personally reviewed following labs and imaging studies  CBC:    Latest Ref Rng & Units 10/22/2023   10:31 AM 11/04/2022    9:23 AM 08/26/2017    3:48 AM  CBC  WBC 4.0 - 10.5 K/uL 4.7  5.3  7.0   Hemoglobin 12.0 - 15.0 g/dL 84.6  84.6  88.2   Hematocrit 36.0 - 46.0 % 45.8  45.5  34.5   Platelets 150.0 - 400.0 K/uL 191.0  215  166     CMP:    Latest Ref Rng & Units 10/22/2023   10:31 AM 11/04/2022    9:23 AM 05/14/2020   11:05 AM  CMP  Glucose 70 - 99 mg/dL 86  87  85   BUN 6 - 23 mg/dL 13  12  12    Creatinine 0.40 - 1.20 mg/dL 8.70  8.93  8.88   Sodium 135 - 145 mEq/L 138  139  139   Potassium 3.5 - 5.1 mEq/L 3.8  4.1  4.0   Chloride 96 - 112 mEq/L 98  99  104   CO2 19 - 32 mEq/L 32  27  24   Calcium 8.4 - 10.5 mg/dL 9.5  9.6  9.3   Total Protein 6.0 - 8.3 g/dL 6.8  6.6    Total Bilirubin 0.2 - 1.2 mg/dL 0.9  0.5    Alkaline Phos 39 - 117 U/L 57  90    AST 0 - 37 U/L 17  20    ALT 0 - 35 U/L 8  11          Anselm Bring, MD 12/28/2023, 2:42 PM  Cc: Zachary Lamar BRAVO, NP

## 2023-12-30 ENCOUNTER — Other Ambulatory Visit

## 2023-12-30 ENCOUNTER — Telehealth: Payer: Self-pay | Admitting: Gastroenterology

## 2023-12-30 DIAGNOSIS — L290 Pruritus ani: Secondary | ICD-10-CM

## 2023-12-30 DIAGNOSIS — R140 Abdominal distension (gaseous): Secondary | ICD-10-CM

## 2023-12-30 DIAGNOSIS — R1013 Epigastric pain: Secondary | ICD-10-CM

## 2023-12-30 NOTE — Telephone Encounter (Signed)
 Inbound call from patient, stating she is on the way to drop off her stool sample, patient states she would like another order to check for parasites as well with the same stool kit she is bringing. The order she currently has is for her pancreas but she would like it added to a parasite test.

## 2023-12-30 NOTE — Telephone Encounter (Signed)
 Returned call to patient & she would like parasite stool test added on to the pancreatic elastase she just dropped off. Received verbal okay from Dr. Charlanne to add on test. Spoke with lab & it will need to be a new sample given with a different kit. Pt has been made aware & will come back by to pick up kit at next availability.

## 2024-01-07 LAB — PANCREATIC ELASTASE, FECAL: Pancreatic Elastase-1, Stool: 422 ug/g (ref >200)

## 2024-01-11 ENCOUNTER — Telehealth: Payer: Self-pay | Admitting: Gastroenterology

## 2024-01-11 NOTE — Telephone Encounter (Signed)
 Inbound call from patient stated she dropped off a stool sample and would like to speak to a nurse in regards of those test results  Please advise  Thank you

## 2024-01-12 NOTE — Telephone Encounter (Signed)
 Left message for pt to call back

## 2024-01-13 NOTE — Telephone Encounter (Signed)
 Returned patient call & advised her once Dr. Charlanne reviews the results we will be in touch with any recommendations. She also would like for him to be aware that she is still having some reflux & her breaths are tender, spreads across chest mostly after meals. Bottom of her throat feels irritated. She's also been doing upper body weights occasionally. Taking nexium  40 mg daily currently.

## 2024-01-13 NOTE — Telephone Encounter (Signed)
 All the labs, recent MRI looks good If still with reflux, can try Nexium  40 mg p.o. twice daily x 1 week, then reduce it to once a day If still with problems, will consider further workup by means of GES. RG

## 2024-01-13 NOTE — Telephone Encounter (Signed)
Patient returning phone call. Requesting a call back. Please advise, thank you.

## 2024-01-14 NOTE — Telephone Encounter (Signed)
 Pt made aware of MD recommendations. She previously had GES 12/2022. Advised her to contact us  if symptoms do not improve. She verbalized all understanding, no further questions.

## 2024-04-14 ENCOUNTER — Emergency Department (HOSPITAL_COMMUNITY)

## 2024-04-14 ENCOUNTER — Encounter (HOSPITAL_COMMUNITY): Payer: Self-pay | Admitting: *Deleted

## 2024-04-14 ENCOUNTER — Emergency Department (HOSPITAL_COMMUNITY)
Admission: EM | Admit: 2024-04-14 | Discharge: 2024-04-14 | Discharge status: Against Medical Advice | Source: Ambulatory Visit | Attending: Emergency Medicine | Admitting: Emergency Medicine

## 2024-04-14 ENCOUNTER — Other Ambulatory Visit: Payer: Self-pay

## 2024-04-14 DIAGNOSIS — R519 Headache, unspecified: Secondary | ICD-10-CM | POA: Diagnosis not present

## 2024-04-14 DIAGNOSIS — R6883 Chills (without fever): Secondary | ICD-10-CM | POA: Insufficient documentation

## 2024-04-14 DIAGNOSIS — R5383 Other fatigue: Secondary | ICD-10-CM | POA: Insufficient documentation

## 2024-04-14 DIAGNOSIS — R4789 Other speech disturbances: Secondary | ICD-10-CM | POA: Diagnosis not present

## 2024-04-14 DIAGNOSIS — R42 Dizziness and giddiness: Secondary | ICD-10-CM | POA: Diagnosis present

## 2024-04-14 DIAGNOSIS — Z5321 Procedure and treatment not carried out due to patient leaving prior to being seen by health care provider: Secondary | ICD-10-CM | POA: Insufficient documentation

## 2024-04-14 DIAGNOSIS — H538 Other visual disturbances: Secondary | ICD-10-CM | POA: Diagnosis not present

## 2024-04-14 LAB — COMPREHENSIVE METABOLIC PANEL WITH GFR
ALT: 13 U/L (ref 0–44)
AST: 21 U/L (ref 15–41)
Albumin: 3.7 g/dL (ref 3.5–5.0)
Alkaline Phosphatase: 51 U/L (ref 38–126)
Anion gap: 11 (ref 5–15)
BUN: 10 mg/dL (ref 8–23)
CO2: 26 mmol/L (ref 22–32)
Calcium: 9.4 mg/dL (ref 8.9–10.3)
Chloride: 102 mmol/L (ref 98–111)
Creatinine, Ser: 1.03 mg/dL — ABNORMAL HIGH (ref 0.44–1.00)
GFR, Estimated: 57 mL/min — ABNORMAL LOW (ref >60)
Glucose, Bld: 100 mg/dL — ABNORMAL HIGH (ref 70–99)
Potassium: 3.7 mmol/L (ref 3.5–5.1)
Sodium: 139 mmol/L (ref 135–145)
Total Bilirubin: 0.8 mg/dL (ref 0.0–1.2)
Total Protein: 6.6 g/dL (ref 6.5–8.1)

## 2024-04-14 LAB — CBC
HCT: 41.9 % (ref 36.0–46.0)
Hemoglobin: 14 g/dL (ref 12.0–15.0)
MCH: 30.7 pg (ref 26.0–34.0)
MCHC: 33.4 g/dL (ref 30.0–36.0)
MCV: 91.9 fL (ref 80.0–100.0)
Platelets: 207 K/uL (ref 150–400)
RBC: 4.56 MIL/uL (ref 3.87–5.11)
RDW: 13.4 % (ref 11.5–15.5)
WBC: 4.1 K/uL (ref 4.0–10.5)
nRBC: 0 % (ref 0.0–0.2)

## 2024-04-14 LAB — URINALYSIS, ROUTINE W REFLEX MICROSCOPIC
Bilirubin Urine: NEGATIVE
Glucose, UA: NEGATIVE mg/dL
Hgb urine dipstick: NEGATIVE
Ketones, ur: NEGATIVE mg/dL
Leukocytes,Ua: NEGATIVE
Nitrite: NEGATIVE
Protein, ur: NEGATIVE mg/dL
Specific Gravity, Urine: 1.006 (ref 1.005–1.030)
pH: 7 (ref 5.0–8.0)

## 2024-04-14 LAB — TROPONIN I (HIGH SENSITIVITY)
Troponin I (High Sensitivity): 4 ng/L (ref <18)
Troponin I (High Sensitivity): 4 ng/L (ref <18)

## 2024-04-14 LAB — LIPASE, BLOOD: Lipase: 24 U/L (ref 11–51)

## 2024-04-14 MED ORDER — ACETAMINOPHEN 325 MG PO TABS
650.0000 mg | ORAL_TABLET | Freq: Four times a day (QID) | ORAL | Status: AC | PRN
  Administered 2024-04-14: 650 mg via ORAL
  Filled 2024-04-14: qty 2

## 2024-04-14 NOTE — ED Notes (Signed)
 MRI to send transport for patient

## 2024-04-14 NOTE — ED Provider Triage Note (Signed)
 Emergency Medicine Provider Triage Evaluation Note  Maria Middleton , a 73 y.o. female  was evaluated in triage.  Pt complains of dizziness, chills, headache, fatigue, blurry vision, slow speech, question left over right weakness.  Spoke with PCP today who encouraged her to come here.  Dors is some chest pain, tightness, shortness of breath.  Review of Systems  Positive: Dizziness, weakness, blurry vision, slowed speech, chest pain, shob Negative:   Physical Exam  BP (!) 146/90   Pulse 62   Temp (!) 97.4 F (36.3 C)   Resp 18   Ht 5' 3 (1.6 m)   Wt 75.3 kg   SpO2 100%   BMI 29.41 kg/m  Gen:   Awake, no distress   Resp:  Normal effort  MSK:   Moves extremities without difficulty  Other:  No objective neurodeficits, she does seem to have some word finding difficulties, she endorses some questionable sensory change on the left  Medical Decision Making  Medically screening exam initiated at 1:33 PM.  Appropriate orders placed.  JEMIAH CUADRA was informed that the remainder of the evaluation will be completed by another provider, this initial triage assessment does not replace that evaluation, and the importance of remaining in the ED until their evaluation is complete.  Workup initiated in triage   Rosan Sherlean DEL, [place] 04/14/24 1333

## 2024-04-14 NOTE — ED Notes (Signed)
 Pt left w/o being seen

## 2024-04-14 NOTE — ED Triage Notes (Signed)
 Pt bib POV c/o dizziness, chills, HA, fatigued, blurry vision, and slowed speech that started over the week. Pt state she was seen at the [facility] and declined to stay. Today she spoke to her PCP who told her to come in asap.

## 2024-05-03 ENCOUNTER — Ambulatory Visit: Payer: Self-pay

## 2024-05-03 NOTE — Telephone Encounter (Signed)
 FYI Only or Action Required?: FYI only for provider: appointment scheduled on 11/12.  Patient was last seen in primary care on 02/18/2023 by [name], MD.  Called Nurse Triage reporting Headache.  Symptoms began several weeks ago.  Interventions attempted: Rest, hydration, or home remedies.  Symptoms are: gradually worsening.  Triage Disposition: See Physician Within 24 Hours  Patient/caregiver understands and will follow disposition?: Yes  Copied from CRM #[id number]. Topic: Clinical - Red Word Triage >> May 03, 2024  9:24 AM Maria Middleton wrote: Red Word that prompted transfer to Nurse Triage: Patient was recently seen in the ER for low sodium. Has been trying to manage it, but states she is still having headaches, that get worse overnight.  Patient wants to schedule with Dr [name] at [facility] Reason for Disposition  [1] MODERATE headache (e.g., interferes with normal activities) AND [2] present > 24 hours AND [3] unexplained  (Exceptions: Pain medicines not tried, typical migraine, or headache part of viral illness.)  Answer Assessment - Initial Assessment Questions Patient looking to establish care with [facility] provider. She has been battling bouts of hyponatremia and feels her [facility] providers missed diagnosing her for months.   5 year survivor kidney cancer Drinking 2.5 L water  daily. Was advised to start adding electrolytes and has been. For the last few weeks she has been having Dizziness, blurry vision, muscle spasms, weak, Cannot think clearly. They ruled out stroke, and determine hyponatremia. Has been on hydrochlorothiazide for a long time but no one has taken her off of it. BP fluctuates- sometimes very high- doesn't want to keep loading up on sodium and make her BP so much worse Endocrinologist-  Eating before bed helping- typically eats before doctors appointments   New patient appt made for next week with Dr [name]. Patient will go into ED for lab work and  assessment today. She will FU with current PCP regarding hydrochlorothiazide.   1. LOCATION: Where does it hurt?      Right and left side 2. ONSET: When did the headache start? (e.g., minutes, hours, days)      Off and on for few weeks  3. PATTERN: Does the pain come and go, or has it been constant since it started?     Goes away at night and then when wakes up in the morning it is there 4. SEVERITY: How bad is the pain? and What does it keep you from doing?  (e.g., Scale 1-10; mild, moderate, or severe)     Pulsating between 3-5/10 5. RECURRENT SYMPTOM: Have you ever had headaches before? If Yes, ask: When was the last time? and What happened that time?      With hyponatremia 6. CAUSE: What do you think is causing the headache?     hyponatremia 7. MIGRAINE: Have you been diagnosed with migraine headaches? If Yes, ask: Is this headache similar?      denies 8. HEAD INJURY: Has there been any recent injury to your head?      denies 9. OTHER SYMPTOMS: Do you have any other symptoms? (e.g., fever, stiff neck, eye pain, sore throat, cold symptoms)     Blurry vision  Protocols used: Headache-A-AH

## 2024-05-10 ENCOUNTER — Ambulatory Visit (INDEPENDENT_AMBULATORY_CARE_PROVIDER_SITE_OTHER): Admitting: Family Medicine

## 2024-05-10 ENCOUNTER — Encounter (HOSPITAL_BASED_OUTPATIENT_CLINIC_OR_DEPARTMENT_OTHER): Payer: Self-pay | Admitting: Family Medicine

## 2024-05-10 VITALS — BP 154/99 | HR 67 | Temp 97.3°F | Resp 16 | Ht 63.39 in | Wt 168.1 lb

## 2024-05-10 DIAGNOSIS — I10 Essential (primary) hypertension: Secondary | ICD-10-CM

## 2024-05-10 DIAGNOSIS — M150 Primary generalized (osteo)arthritis: Secondary | ICD-10-CM | POA: Diagnosis not present

## 2024-05-10 DIAGNOSIS — G4733 Obstructive sleep apnea (adult) (pediatric): Secondary | ICD-10-CM | POA: Diagnosis not present

## 2024-05-10 DIAGNOSIS — M5412 Radiculopathy, cervical region: Secondary | ICD-10-CM | POA: Insufficient documentation

## 2024-05-10 DIAGNOSIS — M8580 Other specified disorders of bone density and structure, unspecified site: Secondary | ICD-10-CM | POA: Insufficient documentation

## 2024-05-10 DIAGNOSIS — Z85528 Personal history of other malignant neoplasm of kidney: Secondary | ICD-10-CM | POA: Insufficient documentation

## 2024-05-10 DIAGNOSIS — K219 Gastro-esophageal reflux disease without esophagitis: Secondary | ICD-10-CM | POA: Insufficient documentation

## 2024-05-10 DIAGNOSIS — Z780 Asymptomatic menopausal state: Secondary | ICD-10-CM

## 2024-05-10 DIAGNOSIS — D3610 Benign neoplasm of peripheral nerves and autonomic nervous system, unspecified: Secondary | ICD-10-CM | POA: Insufficient documentation

## 2024-05-10 NOTE — Progress Notes (Signed)
 New Patient Office Visit  Subjective    Patient ID: Maria Middleton, female    DOB: [birth year]  Age: 73 y.o. MRN: [id number]  CC:  Chief Complaint  Patient presents with   Establish Care    Establish Care     HPI [name] presents to establish care F/u as above.  History of Renal CA noted.  Also h/o electrolyte disturbances.  Has a h/o excessive water  consumption as a cause, and has since corrected the problem.  She definitely feels better now.  Her somewhat extensive PMH is reviewed in detail.  She isn't satisfied with her local sleep specialist, and I'll arrange for another opinion.  Outpatient Encounter Medications as of 05/10/2024  Medication Sig   albuterol (VENTOLIN HFA) 108 (90 Base) MCG/ACT inhaler Inhale into the lungs.   carvedilol (COREG) 3.125 MG tablet 1 tablet with food Orally Twice a day; Duration: 30 days   cholecalciferol (VITAMIN D3) 25 MCG (1000 UNIT) tablet Take 1,000 Units by mouth daily.   fluticasone (FLONASE) 50 MCG/ACT nasal spray Place 1 spray into both nostrils daily.   hydrochlorothiazide (HYDRODIURIL) 25 MG tablet Take 25 mg by mouth daily.   LORazepam  (ATIVAN ) 0.5 MG tablet Take 0.5 mg by mouth at bedtime.   losartan (COZAAR) 100 MG tablet Take 100 mg by mouth daily.   MAGNESIUM  PO Take 1 tablet by mouth daily. With vitamin d    NON FORMULARY Take 2 capsules by mouth as needed (for IBS ( triphala)).   [DISCONTINUED] Ascorbic Acid (VITAMIN C) POWD Take 1 Dose by mouth daily as needed (stress).   [DISCONTINUED] OVER THE COUNTER MEDICATION Take 1 capsule by mouth at bedtime as needed (constipation). Cleanse More herbal laxative   Cyanocobalamin (B-12) 50 MCG TABS Take by mouth. (Patient not taking: Reported on 10/22/2023)   EPINEPHrine  0.3 mg/0.3 mL IJ SOAJ injection Use as directed for life threatening allergic reactions (Patient not taking: Reported on 10/22/2023)   esomeprazole  (NEXIUM ) 40 MG capsule Take 1 capsule (40 mg total) by mouth  daily. (Patient not taking: Reported on 05/10/2024)   fexofenadine  (ALLEGRA ) 180 MG tablet Take 1 tablet (180 mg total) by mouth daily. (Patient taking differently: Take 180 mg by mouth as needed. As needed)   linaclotide  (LINZESS ) 72 MCG capsule Take 1 capsule (72 mcg total) by mouth daily before breakfast. (Patient taking differently: Take 72 mcg by mouth as needed.)   milk thistle 175 MG tablet Take 175 mg by mouth daily. (Patient not taking: Reported on 10/22/2023)   Probiotic Product (PROBIOTIC BLEND PO) Take by mouth. (Patient not taking: Reported on 10/22/2023)   Turmeric (QC TUMERIC COMPLEX PO) Take by mouth. (Patient not taking: Reported on 10/22/2023)   No facility-administered encounter medications on file as of 05/10/2024.    Past Medical History:  Diagnosis Date   Allergic rhinitis    Constipation    Depression    Dyslipidemia    GERD (gastroesophageal reflux disease)    f/by Dr. Charlanne   Hiatal hernia    Hypertension    Hypothyroidism, unspecified    Irritable bowel syndrome    Osteoporosis    f/by Dr. Braulio   Renal cell carcinoma of left kidney [facility])    known to [facility]   Schwannoma    (benign) f/by [facility]   Scoliosis    Sleep apnea    on CPAP, f/by Dr. Mardee   Vitamin D  deficiency     Past Surgical History:  Procedure Laterality Date  ABDOMINAL HYSTERECTOMY     one ovary left   ANKLE SURGERY     right ankle surgery   DILATION AND CURETTAGE OF UTERUS     ROBOTIC ASSITED PARTIAL NEPHRECTOMY Left 08/25/2017   Procedure: XI ROBOTIC ASSITED LEFT PARTIAL NEPHRECTOMY;  Surgeon: Cam Morene ORN, MD;  Location: [facility];  Service: Urology;  Laterality: Left;   scharnoma     biopsy done to right abdomen   TUBAL LIGATION     UPPER GASTROINTESTINAL ENDOSCOPY      Family History  Problem Relation Age of Onset   Hypertension Mother    Stroke Mother    Diabetes Mother    Heart disease Mother    Alcoholism Mother    Stroke Father    Hypertension Father     Heart disease Father    Emphysema Brother    Leukemia Brother    Colon cancer Neg Hx    Esophageal cancer Neg Hx    Rectal cancer Neg Hx    Stomach cancer Neg Hx     Social History   Tobacco Use   Smoking status: Never    Passive exposure: Never   Smokeless tobacco: Never  Vaping Use   Vaping status: Never Used  Substance Use Topics   Alcohol use: Not Currently    Comment: socially   Drug use: No    Review of Systems  Constitutional:  Positive for malaise/fatigue. Negative for diaphoresis, fever and weight loss.  Respiratory:  Negative for cough, shortness of breath and wheezing.   Cardiovascular:  Negative for chest pain, palpitations, orthopnea, claudication, leg swelling and PND.        Objective    BP (!) 154/99 (BP Location: Left Arm, Patient Position: Standing, Cuff Size: Normal)   Pulse 67   Temp (!) 97.3 F (36.3 C) (Oral)   Resp 16   Ht 5' 3.39 (1.61 m)   Wt 168 lb 1.6 oz (76.2 kg)   SpO2 97%   BMI 29.42 kg/m   Physical Exam Constitutional:      General: She is not in acute distress.    Appearance: Normal appearance.  HENT:     Head: Normocephalic.  Neck:     Vascular: No carotid bruit.  Cardiovascular:     Rate and Rhythm: Normal rate and regular rhythm.     Pulses: Normal pulses.     Heart sounds: Normal heart sounds.  Pulmonary:     Effort: Pulmonary effort is normal.     Breath sounds: Normal breath sounds.  Abdominal:     General: Bowel sounds are normal.     Palpations: Abdomen is soft.  Musculoskeletal:     Cervical back: Neck supple. No tenderness.     Right lower leg: No edema.     Left lower leg: No edema.  Neurological:     Mental Status: She is alert.         Assessment & Plan:  Primary hypertension Assessment & Plan: Uncontrolled today, but likely anxiety component.  DASH diet.  She assures me that its fine at home, but was encouraged to f/u in the coming week for a nursing BP check.  Her somewhat recent labs look  pretty good, though its debatable whether she should remain on her hydrochlorothiazide with her h/o Hyponatremia.  Arrange for close follow up as we get to know her better.   Osteopenia after menopause Assessment & Plan: F/u with Dr. Braulio as directed.   Primary osteoarthritis involving multiple  joints  OSA (obstructive sleep apnea) -     Ambulatory referral to Sleep Studies    Return in about 4 weeks (around 06/07/2024) for chronic follow-up.   REDDING PONCE NORLEEN FALCON., MD

## 2024-05-11 ENCOUNTER — Encounter (HOSPITAL_BASED_OUTPATIENT_CLINIC_OR_DEPARTMENT_OTHER): Payer: Self-pay | Admitting: Family Medicine

## 2024-05-11 NOTE — Assessment & Plan Note (Addendum)
 Uncontrolled today, but likely anxiety component.  DASH diet.  She assures me that its fine at home, but was encouraged to f/u in the coming week for a nursing BP check.  Her somewhat recent labs look pretty good, though its debatable whether she should remain on her hydrochlorothiazide with her h/o Hyponatremia.  Arrange for close follow up as we get to know her better.

## 2024-05-11 NOTE — Assessment & Plan Note (Signed)
 F/u with Dr. Braulio as directed.

## 2024-05-22 ENCOUNTER — Ambulatory Visit: Payer: Self-pay

## 2024-05-22 NOTE — Telephone Encounter (Signed)
 FYI Only or Action Required?: Action required by provider: request for appointment and refused ED.  Patient was last seen in primary care on 05/10/2024 by [name] Norleen PHEBE PONCE, MD.  Called Nurse Triage reporting Chest Pain.  Symptoms began a week ago.  Interventions attempted: Other: compression socks for swelling.  Symptoms are: unchanged.  Triage Disposition: Go to ED Now (or PCP Triage)  Patient/caregiver understands and will follow disposition?: No, wishes to speak with PCP                               1. LOCATION: Where does it hurt?       Left side of chest  2. RADIATION: Does the pain go anywhere else? (e.g., into neck, jaw, arms, back)     Denies radiation 3. ONSET: When did the chest pain begin? (Minutes, hours or days)      A week ago 4. PATTERN: Does the pain come and go, or has it been constant since it started?  Does it get worse with exertion?      At first it was intermittent, now it is constant 5. DURATION: How long does it last (e.g., seconds, minutes, hours)     Constant  6. SEVERITY: How bad is the pain?  (e.g., Scale 1-10; mild, moderate, or severe)     Denies pain, describes feeling as chest heaviness 7. CARDIAC RISK FACTORS: Do you have any history of heart problems or risk factors for heart disease? (e.g., angina, prior heart attack; diabetes, high blood pressure, high cholesterol, smoker, or strong family history of heart disease)     HTN 8. PULMONARY RISK FACTORS: Do you have any history of lung disease?  (e.g., blood clots in lung, asthma, emphysema, birth control pills)     RSV earlier this year  9. CAUSE: What do you think is causing the chest pain?     Unsure, recently stopped taking HCTZ 10. OTHER SYMPTOMS: Do you have any other symptoms? (e.g., dizziness, nausea, vomiting, sweating, fever, difficulty breathing, cough)     My breathing feels more labored- patient able to speak in clear and complete  sentences while on phone with this RN, swelling of feet and ankles, fatigue, recent dull headache, BP of 130/85 last Thursday denies dizziness Toe pain 11. PREGNANCY: Is there any chance you are pregnant? When was your last menstrual period?     N/A    Patient stated she stopped taking hydrochlorothiazide 2-3 weeks ago. This RN advised ED for further evaluation of symptoms. Patient declined and requested to be seen in the office. Please advise. Patient would like a call back.   Copied from CRM [id number]. Topic: Clinical - Red Word Triage >> May 22, 2024 10:31 AM Gustabo D wrote: Pt thinks she has a ingrown toenail and says her big toe won't stop hurting left toe. Heaviness in chest heart area and every now and again she feels a heart palpitation, and out of sync , she's tired and sleepy all the time since stopping the -hydrochlorothiazide (HYDRODIURIL) 25 MG tablet Says she had a little palpitations before but says she's feeling something in her chest.  Reason for Disposition  Patient sounds very sick or weak to the triager  Protocols used: Chest Pain-A-AH

## 2024-05-22 NOTE — Telephone Encounter (Signed)
 This RN notified [facility] of ED refusal.

## 2024-06-06 ENCOUNTER — Encounter (HOSPITAL_BASED_OUTPATIENT_CLINIC_OR_DEPARTMENT_OTHER): Payer: Self-pay | Admitting: Family Medicine

## 2024-06-06 ENCOUNTER — Ambulatory Visit (INDEPENDENT_AMBULATORY_CARE_PROVIDER_SITE_OTHER): Admitting: Family Medicine

## 2024-06-06 VITALS — BP 159/109 | HR 70 | Temp 97.7°F | Resp 16 | Wt 170.0 lb

## 2024-06-06 DIAGNOSIS — F331 Major depressive disorder, recurrent, moderate: Secondary | ICD-10-CM | POA: Insufficient documentation

## 2024-06-06 DIAGNOSIS — F5101 Primary insomnia: Secondary | ICD-10-CM

## 2024-06-06 DIAGNOSIS — I10 Essential (primary) hypertension: Secondary | ICD-10-CM

## 2024-06-06 MED ORDER — CLONIDINE HCL 0.1 MG PO TABS: 0.1000 mg | ORAL_TABLET | Freq: Two times a day (BID) | ORAL | 1 refills | Status: AC

## 2024-06-06 MED ORDER — LORAZEPAM 0.5 MG PO TABS: 0.5000 mg | ORAL_TABLET | Freq: Every day | ORAL | 0 refills | Status: DC

## 2024-06-06 NOTE — Assessment & Plan Note (Signed)
 Clinically improved.

## 2024-06-06 NOTE — Assessment & Plan Note (Signed)
 DASH diet.  Trial of low dose Clonidine , and she is to alert us  if  not improved in the coming days.  I encouraged a nursing BP check next week.

## 2024-06-06 NOTE — Assessment & Plan Note (Signed)
 F/u with sleep specialist as directed.  Agreed to a Lorazepam  refill for now, and hopefully this can be replaced in time.  Detailed discussion about the risks in detailed, lay terms.

## 2024-06-06 NOTE — Progress Notes (Signed)
 Established Patient Office Visit  Subjective   Patient ID: Maria Middleton, female    DOB: 06-15-1951  Age: 73 y.o. MRN: [id number]  Chief Complaint  Patient presents with   Follow-up    Follow-up     HPI Discussed the use of AI scribe software for clinical note transcription with the patient, who gave verbal consent to proceed.  History of Present Illness   IRIANNA Middleton is a 73 year old female with insomnia and hypertension who presents with sleep disturbances and blood pressure management concerns.  She has had marked sleep disturbance for the past few nights after running out of her Lorazepam  which was previously provided by Dr. Mardee.  She feels the poor sleep is significantly affecting her daytime functioning and is seeking a safer and effective option to help her rest.  I'm pleased to here that she is quite agreeable to seeing a sleep specialist in [place] who can assist with this.  She is worried that her blood pressure is higher today because of stress and lack of sleep. She has been off hydrochlorothiazide and is cautious with salt intake, especially around the holidays.   She is very sensitive to medications. Amlodipine caused significant leg swelling even at a low dose. She stopped hydrochlorothiazide and carvedilol, as she felt carvedilol affected her heart with exertion. She would like to resume exercise.  She previously had potassium and sodium depletion that affected her ability to walk while taking hydrochlorothiazide. She increased electrolytes at that time but is now off the medication.  She recalls being told to seek evaluation for flank pain but currently denies significant back or flank pain.      Past Medical History:  Diagnosis Date   Allergic rhinitis    Constipation    Depression    Dyslipidemia    GERD (gastroesophageal reflux disease)    f/by Dr. Charlanne   Hiatal hernia    Hypertension    Hypothyroidism, unspecified    Irritable bowel  syndrome    Osteoporosis    f/by Dr. Braulio   Renal cell carcinoma of left kidney [facility])    known to [facility]   Schwannoma    (benign) f/by [facility]   Scoliosis    Sleep apnea    on CPAP, known to Dr. Mardee   Vitamin D  deficiency     Outpatient Encounter Medications as of 06/06/2024  Medication Sig   cholecalciferol (VITAMIN D3) 25 MCG (1000 UNIT) tablet Take 1,000 Units by mouth daily.   cloNIDine  (CATAPRES ) 0.1 MG tablet Take 1 tablet (0.1 mg total) by mouth 2 (two) times daily.   fluticasone (FLONASE) 50 MCG/ACT nasal spray Place 1 spray into both nostrils daily.   losartan (COZAAR) 100 MG tablet Take 100 mg by mouth daily.   MAGNESIUM  PO Take 1 tablet by mouth daily. With vitamin d    NON FORMULARY Take 2 capsules by mouth as needed (for IBS ( triphala)).   [DISCONTINUED] hydrochlorothiazide (HYDRODIURIL) 25 MG tablet Take 25 mg by mouth daily.   [DISCONTINUED] LORazepam  (ATIVAN ) 0.5 MG tablet Take 0.5 mg by mouth at bedtime.   albuterol (VENTOLIN HFA) 108 (90 Base) MCG/ACT inhaler Inhale into the lungs. (Patient not taking: Reported on 06/06/2024)   carvedilol (COREG) 3.125 MG tablet 1 tablet with food Orally Twice a day; Duration: 30 days (Patient not taking: Reported on 06/06/2024)   Cyanocobalamin (B-12) 50 MCG TABS Take by mouth. (Patient not taking: Reported on 10/22/2023)   EPINEPHrine  0.3 mg/0.3  mL IJ SOAJ injection Use as directed for life threatening allergic reactions (Patient not taking: Reported on 10/22/2023)   esomeprazole  (NEXIUM ) 40 MG capsule Take 1 capsule (40 mg total) by mouth daily. (Patient not taking: Reported on 05/10/2024)   fexofenadine  (ALLEGRA ) 180 MG tablet Take 1 tablet (180 mg total) by mouth daily. (Patient taking differently: Take 180 mg by mouth as needed. As needed)   linaclotide  (LINZESS ) 72 MCG capsule Take 1 capsule (72 mcg total) by mouth daily before breakfast. (Patient taking differently: Take 72 mcg by mouth as needed.)   LORazepam  (ATIVAN )  0.5 MG tablet Take 1 tablet (0.5 mg total) by mouth at bedtime.   milk thistle 175 MG tablet Take 175 mg by mouth daily. (Patient not taking: Reported on 10/22/2023)   Probiotic Product (PROBIOTIC BLEND PO) Take by mouth. (Patient not taking: Reported on 10/22/2023)   Turmeric (QC TUMERIC COMPLEX PO) Take by mouth. (Patient not taking: Reported on 10/22/2023)   No facility-administered encounter medications on file as of 06/06/2024.    Social History   Tobacco Use   Smoking status: Never    Passive exposure: Never   Smokeless tobacco: Never  Vaping Use   Vaping status: Never Used  Substance Use Topics   Alcohol use: Not Currently    Comment: socially   Drug use: No      Review of Systems  Constitutional:  Positive for malaise/fatigue. Negative for weight loss.  Cardiovascular:  Negative for chest pain and palpitations.  Neurological:  Negative for dizziness.  Psychiatric/Behavioral:  Negative for depression, hallucinations, memory loss, substance abuse and suicidal ideas. The patient is nervous/anxious and has insomnia.       Objective:     BP (!) 159/109 (BP Location: Left Arm, Patient Position: Standing, Cuff Size: Normal)   Pulse 70   Temp 97.7 F (36.5 C) (Oral)   Resp 16   Wt 170 lb (77.1 kg)   SpO2 96%   BMI 29.75 kg/m    Physical Exam Constitutional:      General: She is not in acute distress.    Appearance: Normal appearance.  HENT:     Head: Normocephalic.  Cardiovascular:     Rate and Rhythm: Normal rate and regular rhythm.     Pulses: Normal pulses.     Heart sounds: Normal heart sounds.  Pulmonary:     Effort: Pulmonary effort is normal.     Breath sounds: Normal breath sounds.  Abdominal:     General: Bowel sounds are normal.     Palpations: Abdomen is soft.  Musculoskeletal:     Cervical back: Neck supple. No tenderness.     Right lower leg: No edema.     Left lower leg: No edema.  Neurological:     Mental Status: She is alert.      No  results found for any visits on 06/06/24.    The 10-year ASCVD risk score (Arnett DK, et al., 2019) is: 44.6%    Assessment & Plan:  Primary insomnia Assessment & Plan: F/u with sleep specialist as directed.  Agreed to a Lorazepam  refill for now, and hopefully this can be replaced in time.  Detailed discussion about the risks in detailed, lay terms.  Orders: -     LORazepam ; Take 1 tablet (0.5 mg total) by mouth at bedtime.  Dispense: 30 tablet; Refill: 0  Primary hypertension Assessment & Plan: DASH diet.  Trial of low dose Clonidine , and she is to alert us  if  not improved in the coming days.  I encouraged a nursing BP check next week.  Orders: -     cloNIDine  HCl; Take 1 tablet (0.1 mg total) by mouth 2 (two) times daily.  Dispense: 60 tablet; Refill: 1  Major depressive disorder, recurrent episode, moderate (HCC) Assessment & Plan: Clinically improved.   I personally spent a total of 25 minutes in the care of the patient today including performing a medically appropriate exam/evaluation, counseling and educating, documenting clinical information in the EHR, and communicating results.   Return in about 4 weeks (around 07/04/2024) for chronic follow-up.    REDDING PONCE NORLEEN FALCON., MD

## 2024-06-13 DIAGNOSIS — I361 Nonrheumatic tricuspid (valve) insufficiency: Secondary | ICD-10-CM | POA: Diagnosis not present

## 2024-06-15 ENCOUNTER — Ambulatory Visit: Admitting: Primary Care

## 2024-06-18 NOTE — Progress Notes (Unsigned)
 "  @Patient  ID: Maria Middleton, female    DOB: 31-Dec-1950, 73 y.o.   MRN: [id number]  No chief complaint on file.   Referring provider: [name] Norleen PHEBE PONCE, MD  HPI: 73 year old female, never smoked. PMH significant for HTN, seasonal allergies, osteopenia, kidney cancer, insomnia, obesity.   06/20/2024 Discussed the use of AI scribe software for clinical note transcription with the patient, who gave verbal consent to proceed.  History of Present Illness Maria Middleton is a 73 year old female with sleep apnea who presents with insomnia and difficulty maintaining sleep.  She experiences significant insomnia characterized by difficulty falling asleep and frequent awakenings, particularly around 3 AM. Her sleep is described as 'terrible', with easy awakenings and trouble getting back to sleep. She has been using Ativan  0.5 mg to aid sleep but is attempting to taper off by skipping doses and halving the medication, as she wishes to discontinue its use.  She was diagnosed with sleep apnea approximately two years ago and has been using a CPAP machine since then. The CPAP was initially set at a pressure of 6, which was later increased to 8 after she reported not noticing a significant difference in sleep quality. Despite regular use of the CPAP, it has not significantly improved her sleep quality, and she occasionally experiences morning headaches when not using the machine. She uses the CPAP about 90% of the time but finds it aggravating on some nights. Her husband has noted snoring when she does not use the CPAP.  She has tried various sleep medications, including clonidine , which was discontinued due to adverse effects when combined with Ativan . Other sleep aids like Ambien and trazodone were also tried without success.   Her sleep routine involves going to bed between 10:30 and 11:30 PM, and it can take up to an hour to fall asleep even with medication. She has a history of a heart attack  and was recently hospitalized for chest pains. She is currently on losartan for blood pressure management. She has a BMI of 29 and acknowledges the need for weight loss. She has attempted cognitive behavioral therapy for sleep training in the past and maintains good sleep hygiene, avoiding activities like watching TV before bed.  She wants to avoid medication and is interested in exploring non-pharmacological options to improve her sleep.  Requesting sleep study results from [facility] in [place]  [facility]       Allergies[1]  Immunization History  Administered Date(s) Administered   Moderna Sars-Covid-2 Vaccination 02/08/2020, 03/11/2020   Pneumococcal Conjugate-13 06/26/2016   Zoster Recombinant(Shingrix) 10/08/2017, 03/15/2018    Past Medical History:  Diagnosis Date   Allergic rhinitis    Constipation    Depression    Dyslipidemia    GERD (gastroesophageal reflux disease)    f/by Dr. Charlanne   Hiatal hernia    Hypertension    Hypothyroidism, unspecified    Irritable bowel syndrome    Osteoporosis    f/by Dr. Braulio   Renal cell carcinoma of left kidney (HCC)    known to [facility]   Schwannoma    (benign) f/by [facility]   Scoliosis    Sleep apnea    on CPAP, known to Dr. Mardee   Vitamin D  deficiency     Tobacco History: Tobacco Use History[2] Counseling given: Not Answered   Outpatient Medications Prior to Visit  Medication Sig Dispense Refill   albuterol (VENTOLIN HFA) 108 (90 Base) MCG/ACT inhaler Inhale into the  lungs. (Patient not taking: Reported on 06/06/2024)     carvedilol (COREG) 3.125 MG tablet 1 tablet with food Orally Twice a day; Duration: 30 days (Patient not taking: Reported on 06/06/2024)     cholecalciferol (VITAMIN D3) 25 MCG (1000 UNIT) tablet Take 1,000 Units by mouth daily.     cloNIDine  (CATAPRES ) 0.1 MG tablet Take 1 tablet (0.1 mg total) by mouth 2 (two) times daily. 60 tablet 1   Cyanocobalamin (B-12) 50 MCG TABS Take by  mouth. (Patient not taking: Reported on 10/22/2023)     EPINEPHrine  0.3 mg/0.3 mL IJ SOAJ injection Use as directed for life threatening allergic reactions (Patient not taking: Reported on 10/22/2023) 2 each 3   esomeprazole  (NEXIUM ) 40 MG capsule Take 1 capsule (40 mg total) by mouth daily. (Patient not taking: Reported on 05/10/2024) 90 capsule 4   fexofenadine  (ALLEGRA ) 180 MG tablet Take 1 tablet (180 mg total) by mouth daily. (Patient taking differently: Take 180 mg by mouth as needed. As needed) 30 tablet 5   fluticasone (FLONASE) 50 MCG/ACT nasal spray Place 1 spray into both nostrils daily.     linaclotide  (LINZESS ) 72 MCG capsule Take 1 capsule (72 mcg total) by mouth daily before breakfast. (Patient taking differently: Take 72 mcg by mouth as needed.) 90 capsule 3   LORazepam  (ATIVAN ) 0.5 MG tablet Take 1 tablet (0.5 mg total) by mouth at bedtime. 30 tablet 0   losartan (COZAAR) 100 MG tablet Take 100 mg by mouth daily.     MAGNESIUM  PO Take 1 tablet by mouth daily. With vitamin d      milk thistle 175 MG tablet Take 175 mg by mouth daily. (Patient not taking: Reported on 10/22/2023)     NON FORMULARY Take 2 capsules by mouth as needed (for IBS ( triphala)).     Probiotic Product (PROBIOTIC BLEND PO) Take by mouth. (Patient not taking: Reported on 10/22/2023)     Turmeric (QC TUMERIC COMPLEX PO) Take by mouth. (Patient not taking: Reported on 10/22/2023)     No facility-administered medications prior to visit.   Review of Systems  Review of Systems  Constitutional:  Positive for fatigue.  Respiratory: Negative.    Psychiatric/Behavioral:  Positive for sleep disturbance.    Physical Exam  There were no vitals taken for this visit. Physical Exam Constitutional:      Appearance: Normal appearance. She is well-developed.  HENT:     Head: Normocephalic and atraumatic.     Mouth/Throat:     Mouth: Mucous membranes are moist.     Pharynx: Oropharynx is clear.  Eyes:     Pupils: Pupils  are equal, round, and reactive to light.  Cardiovascular:     Rate and Rhythm: Normal rate and regular rhythm.     Heart sounds: Normal heart sounds. No murmur heard. Pulmonary:     Effort: Pulmonary effort is normal. No respiratory distress.     Breath sounds: Normal breath sounds. No wheezing or rhonchi.  Musculoskeletal:        General: Normal range of motion.     Cervical back: Normal range of motion and neck supple.  Skin:    General: Skin is warm and dry.     Findings: No erythema or rash.  Neurological:     General: No focal deficit present.     Mental Status: She is alert and oriented to person, place, and time. Mental status is at baseline.  Psychiatric:        Mood  and Affect: Mood normal.        Behavior: Behavior normal.        Thought Content: Thought content normal.        Judgment: Judgment normal.      Lab Results:  CBC    Component Value Date/Time   WBC 4.1 04/14/2024 1329   RBC 4.56 04/14/2024 1329   HGB 14.0 04/14/2024 1329   HGB 15.3 11/04/2022 0923   HCT 41.9 04/14/2024 1329   HCT 45.5 11/04/2022 0923   PLT 207 04/14/2024 1329   PLT 215 11/04/2022 0923   MCV 91.9 04/14/2024 1329   MCV 91 11/04/2022 0923   MCH 30.7 04/14/2024 1329   MCHC 33.4 04/14/2024 1329   RDW 13.4 04/14/2024 1329   RDW 13.2 11/04/2022 0923   LYMPHSABS 1.1 10/22/2023 1031   LYMPHSABS 1.3 11/04/2022 0923   MONOABS 0.4 10/22/2023 1031   EOSABS 0.0 10/22/2023 1031   EOSABS 0.1 11/04/2022 0923   BASOSABS 0.0 10/22/2023 1031   BASOSABS 0.0 11/04/2022 0923    BMET    Component Value Date/Time   NA 139 04/14/2024 1329   NA 139 11/04/2022 0923   K 3.7 04/14/2024 1329   CL 102 04/14/2024 1329   CO2 26 04/14/2024 1329   GLUCOSE 100 (H) 04/14/2024 1329   BUN 10 04/14/2024 1329   BUN 12 11/04/2022 0923   CREATININE 1.03 (H) 04/14/2024 1329   CALCIUM 9.4 04/14/2024 1329   GFRNONAA 57 (L) 04/14/2024 1329   GFRAA 58 (L) 05/14/2020 1105    BNP No results found for:  BNP  ProBNP No results found for: PROBNP  Imaging: No results found.   Assessment & Plan:   1. OSA (obstructive sleep apnea) (Primary)  2. Primary insomnia   Assessment and Plan Assessment & Plan Insomnia Chronic insomnia with difficulty falling asleep, frequent awakenings, and difficulty returning to sleep. Currently using Ativan  0.5 mg for sleep, with attempts to taper off. She wants to avoid medication and is interested in exploring non-pharmacological options to improve her sleep. Cognitive behavioral therapy (CBT) for insomnia is recommended as a non-pharmacological approach. Discussed the possibility of hypnosis, though not commonly used. Emphasized the importance of sleep hygiene and the potential benefits of CBT in reducing medication dependency. - Referred to cognitive behavioral therapy for insomnia. - Advised tapering off Ativan  by taking it every other night or as needed. - Discussed potential use of other non-benzodiazepine medications for insomnia if needed.  Obstructive sleep apnea Diagnosed two years ago, currently using CPAP with a pressure setting of 8 cm H2O. CPAP is effective in controlling apnea, with less than one apneic event per hour. However, CPAP may contribute to sleep disruption. Discussed alternative treatments including oral appliances and Inspire device. Trayvion Embleton is minimally invasive but not suitable for mild sleep apnea. Oral appliances may be more comfortable and less obtrusive than CPAP. Discussed positional therapy with MedCline pillow for side sleepers. - Recommended trying an oral appliance for sleep apnea if CPAP is not tolerated. - Dsicussed positional sleep with side sleeping position encourage and/or wedge pillow (medcline) - If OSA is mild in severity she is likely not a candidate for Inspire  - Suggested using a MedCline pillow for positional therapy. - Will track down previous sleep study results for further evaluation from [facility] sleep  med in [place] - DME company is [facility]   Recording duration: 28 minutes   Almarie LELON Ferrari, NP 06/18/2024     [1]  Allergies  Allergen Reactions   Penicillin G Swelling   Prolia [Denosumab] Other (See Comments)    Patient states she fainted   Sulfa Antibiotics Hives    Other Reaction: GI UPSET   Alendronate Sodium    Amlodipine     Problematic edema even with the 5mg  dosage   Cheese     Congestion    Ciprofloxacin Swelling   Metronidazole Swelling   Milk-Related Compounds     Congestion    Penicillins Nausea Only    Has patient had a PCN reaction causing immediate rash, facial/tongue/throat swelling, SOB or lightheadedness with hypotension: no Has patient had a PCN reaction causing severe rash involving mucus membranes or skin necrosis: no Has patient had a PCN reaction that required hospitalization: no Has patient had a PCN reaction occurring within the last 10 years: no If all of the above answers are NO, then may proceed with Cephalosporin use.    Sulfur  Rash  [2]  Social History Tobacco Use  Smoking Status Never   Passive exposure: Never  Smokeless Tobacco Never   "

## 2024-06-20 ENCOUNTER — Encounter: Payer: Self-pay | Admitting: Primary Care

## 2024-06-20 ENCOUNTER — Ambulatory Visit: Admitting: Primary Care

## 2024-06-20 VITALS — BP 128/62 | HR 77 | Temp 97.8°F | Ht 64.0 in | Wt 170.2 lb

## 2024-06-20 DIAGNOSIS — F5104 Psychophysiologic insomnia: Secondary | ICD-10-CM

## 2024-06-20 DIAGNOSIS — F5101 Primary insomnia: Secondary | ICD-10-CM

## 2024-06-20 DIAGNOSIS — G4733 Obstructive sleep apnea (adult) (pediatric): Secondary | ICD-10-CM

## 2024-06-20 NOTE — Patient Instructions (Addendum)
" ° °  VISIT SUMMARY: During your visit, we discussed your ongoing issues with insomnia and obstructive sleep apnea. You have been experiencing significant difficulty falling asleep and maintaining sleep, despite using a CPAP machine and various sleep medications. We explored non-pharmacological options to improve your sleep quality and discussed potential adjustments to your current treatments.  YOUR PLAN: -INSOMNIA: Insomnia is a condition characterized by difficulty falling asleep, staying asleep, or both. We recommend cognitive behavioral therapy (CBT) for insomnia as a non-pharmacological approach to help improve your sleep. You should continue tapering off Ativan  by taking it every other night or as needed. If necessary, we can explore other non-benzodiazepine medications for insomnia.  -OBSTRUCTIVE SLEEP APNEA: Obstructive sleep apnea is a condition where the airway becomes blocked during sleep, causing breathing to stop and start repeatedly. You are currently using a CPAP machine with a pressure setting of 8 cm H2O, which is effective in controlling apnea but may contribute to sleep disruption. We discussed trying an oral appliance if CPAP is not tolerated, and the Inspire device as a potential option if your sleep apnea severity increases. Additionally, using a MedCline pillow for positional therapy may help improve your sleep quality.  INSTRUCTIONS: You have been referred to cognitive behavioral therapy for insomnia. Please continue tapering off Ativan  by taking it every other night or as needed. If you experience any issues with your CPAP machine or need further evaluation, we will track down your previous sleep study results. Consider trying an oral appliance or a MedCline pillow for positional therapy if you find the CPAP machine uncomfortable.   Recommend calling [facility]  PHONE: [phone]  Orders: Get sleep study results from  medicine in [place]  or [facility]  Orthodontics dentist- Dr. Nena Riding and Dr. Micky    "

## 2024-07-04 ENCOUNTER — Ambulatory Visit (HOSPITAL_BASED_OUTPATIENT_CLINIC_OR_DEPARTMENT_OTHER): Admitting: Family Medicine

## 2024-07-14 ENCOUNTER — Other Ambulatory Visit: Payer: Self-pay

## 2024-07-14 ENCOUNTER — Emergency Department (HOSPITAL_COMMUNITY)

## 2024-07-14 ENCOUNTER — Emergency Department (HOSPITAL_COMMUNITY): Admission: EM | Admit: 2024-07-14 | Discharge: 2024-07-14 | Disposition: A | Discharge status: Home / Self Care

## 2024-07-14 DIAGNOSIS — R1032 Left lower quadrant pain: Secondary | ICD-10-CM | POA: Diagnosis not present

## 2024-07-14 DIAGNOSIS — R1030 Lower abdominal pain, unspecified: Secondary | ICD-10-CM | POA: Diagnosis present

## 2024-07-14 DIAGNOSIS — Z7982 Long term (current) use of aspirin: Secondary | ICD-10-CM | POA: Insufficient documentation

## 2024-07-14 DIAGNOSIS — I10 Essential (primary) hypertension: Secondary | ICD-10-CM | POA: Insufficient documentation

## 2024-07-14 LAB — CBC WITH DIFFERENTIAL/PLATELET
Abs Immature Granulocytes: 0.02 K/uL (ref 0.00–0.07)
Basophils Absolute: 0 K/uL (ref 0.0–0.1)
Basophils Relative: 0 %
Eosinophils Absolute: 0 K/uL (ref 0.0–0.5)
Eosinophils Relative: 0 %
HCT: 41.7 % (ref 36.0–46.0)
Hemoglobin: 14.2 g/dL (ref 12.0–15.0)
Immature Granulocytes: 0 %
Lymphocytes Relative: 11 %
Lymphs Abs: 0.8 K/uL (ref 0.7–4.0)
MCH: 31.3 pg (ref 26.0–34.0)
MCHC: 34.1 g/dL (ref 30.0–36.0)
MCV: 91.9 fL (ref 80.0–100.0)
Monocytes Absolute: 0.4 K/uL (ref 0.1–1.0)
Monocytes Relative: 6 %
Neutro Abs: 5.8 K/uL (ref 1.7–7.7)
Neutrophils Relative %: 83 %
Platelets: 190 K/uL (ref 150–400)
RBC: 4.54 MIL/uL (ref 3.87–5.11)
RDW: 14.4 % (ref 11.5–15.5)
WBC: 7 K/uL (ref 4.0–10.5)
nRBC: 0 % (ref 0.0–0.2)

## 2024-07-14 LAB — COMPREHENSIVE METABOLIC PANEL WITH GFR
ALT: 12 U/L (ref 0–44)
AST: 24 U/L (ref 15–41)
Albumin: 3.9 g/dL (ref 3.5–5.0)
Alkaline Phosphatase: 77 U/L (ref 38–126)
Anion gap: 11 (ref 5–15)
BUN: 14 mg/dL (ref 8–23)
CO2: 23 mmol/L (ref 22–32)
Calcium: 9.6 mg/dL (ref 8.9–10.3)
Chloride: 102 mmol/L (ref 98–111)
Creatinine, Ser: 0.95 mg/dL (ref 0.44–1.00)
GFR, Estimated: >60 mL/min
Glucose, Bld: 109 mg/dL — ABNORMAL HIGH (ref 70–99)
Potassium: 3.9 mmol/L (ref 3.5–5.1)
Sodium: 136 mmol/L (ref 135–145)
Total Bilirubin: 0.6 mg/dL (ref 0.0–1.2)
Total Protein: 6.7 g/dL (ref 6.5–8.1)

## 2024-07-14 LAB — URINALYSIS, ROUTINE W REFLEX MICROSCOPIC
Bilirubin Urine: NEGATIVE
Glucose, UA: NEGATIVE mg/dL
Hgb urine dipstick: NEGATIVE
Ketones, ur: 20 mg/dL — AB
Leukocytes,Ua: NEGATIVE
Nitrite: NEGATIVE
Protein, ur: NEGATIVE mg/dL
Specific Gravity, Urine: 1.013 (ref 1.005–1.030)
pH: 9 — ABNORMAL HIGH (ref 5.0–8.0)

## 2024-07-14 LAB — LIPASE, BLOOD: Lipase: 11 U/L (ref 11–51)

## 2024-07-14 MED ORDER — SODIUM CHLORIDE 0.9 % IV BOLUS
500.0000 mL | Freq: Once | INTRAVENOUS | Status: AC
  Administered 2024-07-14: 500 mL via INTRAVENOUS

## 2024-07-14 MED ORDER — LACTATED RINGERS IV BOLUS
1000.0000 mL | Freq: Once | INTRAVENOUS | Status: AC
  Administered 2024-07-14: 1000 mL via INTRAVENOUS

## 2024-07-14 MED ORDER — FENTANYL CITRATE (PF) 50 MCG/ML IJ SOSY
50.0000 ug | PREFILLED_SYRINGE | Freq: Once | INTRAMUSCULAR | Status: DC
  Filled 2024-07-14: qty 1

## 2024-07-14 MED ORDER — IOHEXOL 350 MG/ML SOLN
75.0000 mL | Freq: Once | INTRAVENOUS | Status: AC | PRN
  Administered 2024-07-14: 75 mL via INTRAVENOUS

## 2024-07-14 MED ORDER — PROCHLORPERAZINE EDISYLATE 10 MG/2ML IJ SOLN
5.0000 mg | Freq: Once | INTRAMUSCULAR | Status: DC
  Filled 2024-07-14: qty 2

## 2024-07-14 MED ORDER — ACETAMINOPHEN 325 MG PO TABS
650.0000 mg | ORAL_TABLET | Freq: Once | ORAL | Status: AC
  Administered 2024-07-14: 650 mg via ORAL
  Filled 2024-07-14: qty 2

## 2024-07-14 MED ORDER — ONDANSETRON HCL 4 MG/2ML IJ SOLN
4.0000 mg | Freq: Once | INTRAMUSCULAR | Status: DC
  Filled 2024-07-14: qty 2

## 2024-07-14 MED ORDER — OXYCODONE-ACETAMINOPHEN 5-325 MG PO TABS: 1.0000 | ORAL_TABLET | Freq: Four times a day (QID) | ORAL | 0 refills | Status: AC | PRN

## 2024-07-14 MED ORDER — MORPHINE SULFATE (PF) 4 MG/ML IV SOLN
4.0000 mg | Freq: Once | INTRAVENOUS | Status: AC
  Administered 2024-07-14: 4 mg via INTRAVENOUS
  Filled 2024-07-14: qty 1

## 2024-07-14 NOTE — ED Notes (Signed)
 Warm blanket applied

## 2024-07-14 NOTE — ED Provider Notes (Signed)
 " [place] EMERGENCY DEPARTMENT AT [facility] Provider Note   CSN: [id number] Arrival date & time: 07/14/24  1347     Patient presents with: Pelvic Pain   Maria Middleton is a 74 y.o. female.   74 year old female with past medical history of hypertension hyperlipidemia and remote hysterectomy presenting to the emergency department today with right and left lower abdominal pain.  The patient states this been going on now for the past week or so.  She states that she has been having normal bowel movements and denies any associated nausea or vomiting.  She did follow-up with her OB/GYN a few days ago and had a pelvic exam which was unremarkable.  Also had vaginal swabs which were negative.  The patient is having persistent pain.  She was brought to the ER for further evaluation regarding this.   Pelvic Pain       Prior to Admission medications  Medication Sig Start Date End Date Taking? Authorizing Provider  albuterol (VENTOLIN HFA) 108 (90 Base) MCG/ACT inhaler Inhale into the lungs. 01/13/24   [provider]  ASPIRIN REGIMEN 81 MG tablet Take by mouth. 06/15/24   [provider]  carvedilol (COREG) 3.125 MG tablet 1 tablet with food Orally Twice a day; Duration: 30 days Patient not taking: Reported on 06/20/2024 04/17/24   [provider]  cholecalciferol (VITAMIN D3) 25 MCG (1000 UNIT) tablet Take 1,000 Units by mouth daily.    [provider]  cloNIDine  (CATAPRES ) 0.1 MG tablet Take 1 tablet (0.1 mg total) by mouth 2 (two) times daily. Patient not taking: Reported on 06/20/2024 06/06/24   [name] Norleen PHEBE PONCE, MD  Cyanocobalamin (B-12) 50 MCG TABS Take by mouth. Patient not taking: Reported on 06/20/2024    [provider]  EPINEPHrine  0.3 mg/0.3 mL IJ SOAJ injection Use as directed for life threatening allergic reactions Patient not taking: Reported on 06/20/2024 05/12/23   Kozlow, Eric J, MD  esomeprazole  (NEXIUM ) 40 MG  capsule Take 1 capsule (40 mg total) by mouth daily. Patient not taking: Reported on 06/20/2024 12/28/23   Charlanne Groom, MD  fexofenadine  (ALLEGRA ) 180 MG tablet Take 1 tablet (180 mg total) by mouth daily. 02/16/23   Iva Marty Saltness, MD  fluticasone (FLONASE) 50 MCG/ACT nasal spray Place 1 spray into both nostrils daily. 08/15/14   [provider]  linaclotide  (LINZESS ) 72 MCG capsule Take 1 capsule (72 mcg total) by mouth daily before breakfast. Patient not taking: Reported on 06/20/2024 06/14/23   Charlanne Groom, MD  LORazepam  (ATIVAN ) 0.5 MG tablet Take 1 tablet (0.5 mg total) by mouth at bedtime. 06/06/24   [name] Norleen PHEBE PONCE, MD  losartan (COZAAR) 100 MG tablet Take 100 mg by mouth daily. 12/17/22   [provider]  MAGNESIUM  PO Take 1 tablet by mouth daily. With vitamin d     [provider]  metoprolol tartrate (LOPRESSOR) 25 MG tablet Take 25 mg by mouth daily. 06/15/24   [provider]  milk thistle 175 MG tablet Take 175 mg by mouth daily. Patient not taking: Reported on 06/20/2024    [provider]  NON FORMULARY Take 2 capsules by mouth as needed (for IBS ( triphala)).    [provider]  Probiotic Product (PROBIOTIC BLEND PO) Take by mouth. Patient not taking: Reported on 06/20/2024    [provider]  Turmeric (QC TUMERIC COMPLEX PO) Take by mouth.    [provider]    Allergies: Penicillin  g, Prolia [denosumab], Sulfa antibiotics, Alendronate sodium, Amlodipine, Cheese, Ciprofloxacin, Metronidazole, Milk-related compounds, Penicillins, and Sulfur     Review of Systems  Genitourinary:  Positive for pelvic pain.  All other systems reviewed and are negative.   Updated Vital Signs BP (!) 166/92 (BP Location: Right Wrist)   Pulse 89   Resp (!) 22   Ht 5' 4 (1.626 m)   Wt 77.2 kg   SpO2 100%   BMI 29.21 kg/m   Physical Exam Vitals and nursing note reviewed.   Gen: NAD Eyes: PERRL, EOMI HEENT:  no oropharyngeal swelling Neck: trachea midline Resp: clear to auscultation bilaterally Card: RRR, no murmurs, rubs, or gallops Abd: tender over R and L lower quadrant without guarding or rebound Extremities: no calf tenderness, no edema Vascular: 2+ radial pulses bilaterally, 2+ DP pulses bilaterally Skin: no rashes Psyc: acting appropriately   (all labs ordered are listed, but only abnormal results are displayed) Labs Reviewed  COMPREHENSIVE METABOLIC PANEL WITH GFR - Abnormal; Notable for the following components:      Result Value   Glucose, Bld 109 (*)    All other components within normal limits  CBC WITH DIFFERENTIAL/PLATELET  LIPASE, BLOOD  URINALYSIS, ROUTINE W REFLEX MICROSCOPIC    EKG: EKG Interpretation Date/Time:  Friday July 14 2024 14:10:02 EST Ventricular Rate:  88 PR Interval:  200 QRS Duration:  158 QT Interval:  439 QTC Calculation: 532 R Axis:   60  Text Interpretation: Sinus rhythm Left bundle branch block Confirmed by Ula Barter [phone]) on 07/14/2024 2:36:34 PM  Radiology: No results found.   Procedures   Medications Ordered in the ED  lactated ringers  bolus 1,000 mL (has no administration in time range)  ondansetron  (ZOFRAN ) injection 4 mg (4 mg Intravenous Patient Refused/Not Given 07/14/24 1500)  lactated ringers  bolus 1,000 mL (1,000 mLs Intravenous New Bag/Given 07/14/24 1459)  morphine  (PF) 4 MG/ML injection 4 mg (4 mg Intravenous Given 07/14/24 1453)                                    Medical Decision Making 74 year old female with past medical history of hypertension hyperlipidemia with remote hysterectomy presenting to the emergency department today with right and left lower quadrant abdominal pain.  The patient states this has been going now for the past week or so.  I will further evaluate the patient here with basic labs including LFTs and a lipase to evaluate for hepatobiliary pathology or pancreatitis.  Will obtain a urinalysis to  evaluate for urinary tract infection.  Will obtain CT scan of her abdomen to evaluate for appendicitis, diverticulitis, colitis, obstruction, or other intra-abdominal pathology.  I will give the patient morphine  and Zofran  for symptoms and reevaluate for ultimate disposition.  The patient initial labs are unremarkable.  CT scan is pending at the time of signout.  Plan is for reevaluation after CT for ultimate disposition.  Amount and/or Complexity of Data Reviewed Labs: ordered. Radiology: ordered.  Risk Prescription drug management.        Final diagnoses:  Lower abdominal pain    ED Discharge Orders     None          Ula Barter SAUNDERS, MD 07/14/24 1545  "

## 2024-07-14 NOTE — ED Provider Notes (Signed)
" °  Physical Exam  BP (!) 154/99   Pulse 89   Temp 98 F (36.7 C) (Oral)   Resp 15   Ht 5' 4 (1.626 m)   Wt 77.2 kg   SpO2 100%   BMI 29.21 kg/m   Physical Exam  Procedures  Procedures  ED Course / MDM    Medical Decision Making Amount and/or Complexity of Data Reviewed Labs: ordered. Radiology: ordered.  Risk OTC drugs. Prescription drug management.   Received in signout pelvic pain.  Also dull headache.  Has some hypertension.  Recently started hydralazine.  Blood work reassuring.  CT scan done and shows only old mass.  Initiate plan discharge for patient but then began to vomit.  Given Tylenol  since patient refused antiemetics or pain medicine.  Blood pressures improved some.  I think stable for discharge home.  Follow-up with PCP and cardiology.       Patsey Lot, MD 07/14/24 2009  "

## 2024-07-14 NOTE — ED Triage Notes (Signed)
 Pt Maria Middleton [facility] for pelvic pain for about 1 week now. Urgent care Monday, sent to OBGYN. Hysterectomy 1 month ago. Hx of low sodium, taken of hydrochlorothiazide. Started metoprolol then stopped, started hydralazine yesterday. Has taken it before and stated it made her sick before  Heart cath Dec 16th, exploratory and no abnormal findings or stents placed.   VS L Bundle on 12-lead, 160/96, Hr 95, 95% RA  18 L AC

## 2024-07-18 ENCOUNTER — Ambulatory Visit (INDEPENDENT_AMBULATORY_CARE_PROVIDER_SITE_OTHER): Admitting: Family Medicine

## 2024-07-18 ENCOUNTER — Encounter (HOSPITAL_BASED_OUTPATIENT_CLINIC_OR_DEPARTMENT_OTHER): Payer: Self-pay | Admitting: Family Medicine

## 2024-07-18 VITALS — BP 164/119 | HR 95 | Temp 97.6°F | Resp 16 | Wt 170.9 lb

## 2024-07-18 DIAGNOSIS — F5101 Primary insomnia: Secondary | ICD-10-CM

## 2024-07-18 DIAGNOSIS — I160 Hypertensive urgency: Secondary | ICD-10-CM | POA: Diagnosis not present

## 2024-07-18 DIAGNOSIS — G4733 Obstructive sleep apnea (adult) (pediatric): Secondary | ICD-10-CM | POA: Diagnosis not present

## 2024-07-18 DIAGNOSIS — F419 Anxiety disorder, unspecified: Secondary | ICD-10-CM | POA: Diagnosis not present

## 2024-07-18 MED ORDER — LORAZEPAM 0.5 MG PO TABS: 0.5000 mg | ORAL_TABLET | Freq: Every day | ORAL | 0 refills | Status: AC

## 2024-07-18 NOTE — Assessment & Plan Note (Signed)
 I agreed to an additional short term benzo refill until proper evaluation by her new sleep specialist. Orders:   LORazepam  (ATIVAN ) 0.5 MG tablet; Take 1 tablet (0.5 mg total) by mouth at bedtime.

## 2024-07-18 NOTE — Assessment & Plan Note (Signed)
 Will encourage an SSRI in the coming days, once time allows for a proper discussion.  She was understandably more interested in an extended discussion about HTN management today.

## 2024-07-18 NOTE — Progress Notes (Signed)
 "  Established Patient Office Visit  Subjective   Patient ID: Maria Middleton, female    DOB: Apr 23, 1951  Age: 74 y.o. MRN: [id number]  Chief Complaint  Patient presents with   Follow-up    Follow-up    Discussed the use of AI scribe software for clinical note transcription with the patient, who gave verbal consent to proceed.  History of Present Illness Maria Middleton is a 74 year old female with hypertension and sleep apnea who presents for management of blood pressure and sleep apnea.  She has been experiencing ongoing difficulty in managing her blood pressure. She has been on losartan 100 mg, which has not been effective. She recently saw an endocrinologist who recommended switching to telmisartan, which she plans to start tomorrow. She has previously tried metoprolol, which was discontinued due to reported ineffectiveness, and hydralazine, which caused significant adverse effects. Clonidine  led to fatigue, amlodipine caused swelling, and both metoprolol and carvedilol resulted in bradycardia.  She is clearly very sensitive to medications in general.  Recent [facility] records reviewed also note an anxiety component to her HTN.  Recent normal Cardiac Cath noted.  She has a history of sleep apnea and has just started seeing a sleep specialist.  She understands my reservations about Lorazepam  and that her new specialist must agree that its necessary.  She experiences upper back muscular pain and takes 400 mg of magnesium  to help with muscular tightness, which she finds beneficial.  She monitors her blood pressure at home, taking readings both sitting and standing.  I encouraged standing BP readings and reminded her to minimize her salt intake.    Past Medical History:  Diagnosis Date   Allergic rhinitis    Anxiety    Depression    Dyslipidemia    GERD (gastroesophageal reflux disease)    f/by Dr. Charlanne   Hiatal hernia    Hypertension    Hypothyroidism, unspecified    Irritable  bowel syndrome    Migraine    Osteoporosis    f/by Dr. Braulio   Renal cell carcinoma of left kidney [facility])    known to [facility]   Schwannoma    (benign) f/by [facility]   Scoliosis    Sleep apnea    on CPAP, f/by [facility]   Vitamin D  deficiency     Outpatient Encounter Medications as of 07/18/2024  Medication Sig   albuterol (VENTOLIN HFA) 108 (90 Base) MCG/ACT inhaler Inhale into the lungs.   ASPIRIN REGIMEN 81 MG tablet Take by mouth.   cholecalciferol (VITAMIN D3) 25 MCG (1000 UNIT) tablet Take 1,000 Units by mouth daily.   fexofenadine  (ALLEGRA ) 180 MG tablet Take 1 tablet (180 mg total) by mouth daily.   fluticasone (FLONASE) 50 MCG/ACT nasal spray Place 1 spray into both nostrils daily.   MAGNESIUM  PO Take 1 tablet by mouth daily. With vitamin d    NON FORMULARY Take 2 capsules by mouth as needed (for IBS ( triphala)).   oxyCODONE -acetaminophen  (PERCOCET/ROXICET) 5-325 MG tablet Take 1 tablet by mouth every 6 (six) hours as needed for severe pain (pain score 7-10).   telmisartan (MICARDIS) 40 MG tablet 1 tablet by mouth Once a day; Duration: 30 days STOP losartan   Turmeric (QC TUMERIC COMPLEX PO) Take by mouth.   [DISCONTINUED] LORazepam  (ATIVAN ) 0.5 MG tablet Take 1 tablet (0.5 mg total) by mouth at bedtime.   [DISCONTINUED] losartan (COZAAR) 100 MG tablet Take 100 mg by mouth daily.   [DISCONTINUED] metoprolol tartrate (  LOPRESSOR) 25 MG tablet Take 25 mg by mouth daily.   carvedilol (COREG) 3.125 MG tablet 1 tablet with food Orally Twice a day; Duration: 30 days (Patient not taking: Reported on 06/20/2024)   cloNIDine  (CATAPRES ) 0.1 MG tablet Take 1 tablet (0.1 mg total) by mouth 2 (two) times daily. (Patient not taking: Reported on 06/20/2024)   Cyanocobalamin (B-12) 50 MCG TABS Take by mouth. (Patient not taking: Reported on 06/20/2024)   EPINEPHrine  0.3 mg/0.3 mL IJ SOAJ injection Use as directed for life threatening allergic reactions (Patient not taking:  Reported on 06/20/2024)   esomeprazole  (NEXIUM ) 40 MG capsule Take 1 capsule (40 mg total) by mouth daily. (Patient not taking: Reported on 06/20/2024)   linaclotide  (LINZESS ) 72 MCG capsule Take 1 capsule (72 mcg total) by mouth daily before breakfast. (Patient not taking: Reported on 06/20/2024)   LORazepam  (ATIVAN ) 0.5 MG tablet Take 1 tablet (0.5 mg total) by mouth at bedtime.   milk thistle 175 MG tablet Take 175 mg by mouth daily. (Patient not taking: Reported on 06/20/2024)   Probiotic Product (PROBIOTIC BLEND PO) Take by mouth. (Patient not taking: Reported on 06/20/2024)   No facility-administered encounter medications on file as of 07/18/2024.    Social History   Tobacco Use   Smoking status: Never    Passive exposure: Never   Smokeless tobacco: Never  Vaping Use   Vaping status: Never Used  Substance Use Topics   Alcohol use: Not Currently    Comment: socially   Drug use: No      Review of Systems  Constitutional:  Negative for diaphoresis, fever, malaise/fatigue and weight loss.  Respiratory:  Negative for cough, shortness of breath and wheezing.   Cardiovascular:  Negative for chest pain, palpitations, orthopnea, claudication, leg swelling and PND.      Objective:     BP (!) 164/119 (Cuff Size: Normal)   Pulse 95   Temp 97.6 F (36.4 C) (Oral)   Resp 16   Wt 170 lb 14.4 oz (77.5 kg)   SpO2 97%   BMI 29.33 kg/m    Physical Exam Constitutional:      General: She is not in acute distress.    Appearance: Normal appearance.  HENT:     Head: Normocephalic.  Neck:     Vascular: No carotid bruit.  Cardiovascular:     Rate and Rhythm: Normal rate and regular rhythm.     Pulses: Normal pulses.     Heart sounds: Normal heart sounds.  Pulmonary:     Effort: Pulmonary effort is normal.     Breath sounds: Normal breath sounds.  Abdominal:     General: Bowel sounds are normal.     Palpations: Abdomen is soft.  Musculoskeletal:     Cervical back: Neck  supple. No tenderness.     Right lower leg: No edema.     Left lower leg: No edema.  Neurological:     Mental Status: She is alert.      No results found for any visits on 07/18/24.    The 10-year ASCVD risk score (Arnett DK, et al., 2019) is: 46.3%    Assessment & Plan:   Assessment & Plan Hypertensive urgency Chronic hypertension with inadequate control.  Recent switch to Telmisartan noted and she seems to understand that many patients require more than one medication for proper control.  Not currently clear if she needs two drug therapy at this time.  She is challenging to treat and will place  a high priority referral to PharmD HTN management team which she is quite agreeable to.  Intolerance to multiple antihypertensives including hydralazine, clonidine , amlodipine, metoprolol, and carvedilol. Potential need for secondary hypertension evaluation in the future.  Sleep apnea may also be contributing to her hypertensive control. -  - Referred to PharmD hypertensive team for specialized management. - Updated allergy  and intolerance list in medical record. - Monitor blood pressure at home, prioritizing standing measurements. - Provided handout on hypertension. - Ensure follow-up with cardiologist and sleep specialist. Orders:   Amb Referral to Clinical Pharmacist  Anxiety Will encourage an SSRI in the coming days, once time allows for a proper discussion.  She was understandably more interested in an extended discussion about HTN management today.    Primary insomnia I agreed to an additional short term benzo refill until proper evaluation by her new sleep specialist. Orders:   LORazepam  (ATIVAN ) 0.5 MG tablet; Take 1 tablet (0.5 mg total) by mouth at bedtime.  OSA (obstructive sleep apnea) F/u with Sleep specialist as directed.  Further discussion as above.        Return in about 3 months (around 10/16/2024) for chronic follow-up.    REDDING PONCE NORLEEN FALCON., MD "

## 2024-07-18 NOTE — Assessment & Plan Note (Signed)
 F/u with Sleep specialist as directed.  Further discussion as above.

## 2024-07-20 ENCOUNTER — Encounter (HOSPITAL_BASED_OUTPATIENT_CLINIC_OR_DEPARTMENT_OTHER): Payer: Self-pay | Admitting: *Deleted

## 2024-07-20 ENCOUNTER — Telehealth (HOSPITAL_BASED_OUTPATIENT_CLINIC_OR_DEPARTMENT_OTHER): Payer: Self-pay | Admitting: *Deleted

## 2024-07-21 NOTE — Telephone Encounter (Signed)
 LMOM to call back

## 2024-08-02 ENCOUNTER — Telehealth (HOSPITAL_BASED_OUTPATIENT_CLINIC_OR_DEPARTMENT_OTHER): Payer: Self-pay | Admitting: Family Medicine

## 2024-08-02 NOTE — Telephone Encounter (Signed)
 Copied from CRM [id number]. Topic: Referral - Status >> Aug 02, 2024  1:07 PM Gustabo D wrote: Pt calling to get the status of a referral that was placed for hypertensive - out of [place] no one has contacted her it's been going on 2 weeks.  Says the nurse called about her possibly taking a anxiety medication to help with bp but they never completed the conversation Please call pt with a update- [phone]

## 2024-08-03 ENCOUNTER — Ambulatory Visit: Payer: Self-pay

## 2024-08-03 ENCOUNTER — Other Ambulatory Visit (HOSPITAL_BASED_OUTPATIENT_CLINIC_OR_DEPARTMENT_OTHER): Payer: Self-pay

## 2024-08-03 ENCOUNTER — Ambulatory Visit (HOSPITAL_BASED_OUTPATIENT_CLINIC_OR_DEPARTMENT_OTHER)

## 2024-08-03 MED ORDER — CLONIDINE 0.2 MG/24HR TD PTWK
0.2000 mg | MEDICATED_PATCH | TRANSDERMAL | 3 refills | Status: AC
  Filled 2024-08-03: qty 4, 28d supply, fill #0

## 2024-08-03 NOTE — Telephone Encounter (Signed)
 FYI Only or Action Required?: Action required by provider: request for appointment. Requesting BP check today at 1300-attempted calling [facility] no answer, pt understands she does not have an appt, she states she is going to the clinic at 1300 today.  Patient was last seen in primary care on 07/18/2024 by Maria Norleen PHEBE PONCE, MD.  Called Nurse Triage reporting Hypertension.  Symptoms are: unchanged.  Triage Disposition: See PCP Within 2 Weeks  Patient/caregiver understands and will follow disposition?: No  Reason for Triage: High bp,, light headedness/headache 160/100 last night . Wants to come in for a bp check    Reason for Disposition  Patient wants doctor (or NP/PA) to measure BP  Answer Assessment - Initial Assessment Questions 1. BLOOD PRESSURE: What is your blood pressure? Did you take at least two measurements 5 minutes apart?     States she is in the car and the last time she checked it was yesterday, cannot check BP at this time 2. ONSET: When did you take your blood pressure?     yesterday 4. HISTORY: Do you have a history of high blood pressure?     yes 6. OTHER SYMPTOMS: Do you have any symptoms? (e.g., blurred vision, chest pain, difficulty breathing, headache, weakness)     Mild HA  Pt states that she was switched to a new BP medication and does not believe that it is working. Pt calling for BP check in clinic, requests RN call [facility] to schedule BP vs sending CRM as advised. [facility] did not answer, request will be sent to the clinic, pt was advised that clinic will call back and schedule a BP, pt states she will be going to the clinic at 1300. Pt aware that she does not have a BP check appt at this time.  Protocols used: Blood Pressure - High-A-AH

## 2024-08-21 ENCOUNTER — Ambulatory Visit (HOSPITAL_BASED_OUTPATIENT_CLINIC_OR_DEPARTMENT_OTHER): Admitting: Family Medicine

## 2024-08-22 ENCOUNTER — Encounter: Admitting: Pulmonary Disease
